# Patient Record
Sex: Male | Born: 1955 | Race: Black or African American | Hispanic: No | Marital: Married | Smoking: Never smoker
Health system: Southern US, Community
[De-identification: ages and names within clinical notes are randomized; demographics above are authoritative.]

## PROBLEM LIST (undated history)

## (undated) DIAGNOSIS — E785 Hyperlipidemia, unspecified: Secondary | ICD-10-CM

## (undated) HISTORY — DX: Hyperlipidemia, unspecified: E78.5

---

## 2006-07-25 ENCOUNTER — Emergency Department (HOSPITAL_COMMUNITY): Admission: EM | Admit: 2006-07-25 | Discharge: 2006-07-25 | Payer: Self-pay | Admitting: Emergency Medicine

## 2014-11-01 ENCOUNTER — Ambulatory Visit (INDEPENDENT_AMBULATORY_CARE_PROVIDER_SITE_OTHER): Payer: 59

## 2014-11-01 ENCOUNTER — Ambulatory Visit (INDEPENDENT_AMBULATORY_CARE_PROVIDER_SITE_OTHER): Payer: 59 | Admitting: Internal Medicine

## 2014-11-01 VITALS — BP 114/70 | HR 88 | Temp 97.7°F | Resp 16 | Ht 75.0 in | Wt 148.8 lb

## 2014-11-01 DIAGNOSIS — R9431 Abnormal electrocardiogram [ECG] [EKG]: Secondary | ICD-10-CM

## 2014-11-01 DIAGNOSIS — Z0000 Encounter for general adult medical examination without abnormal findings: Secondary | ICD-10-CM | POA: Diagnosis not present

## 2014-11-01 LAB — LIPID PANEL
Cholesterol: 212 mg/dL — ABNORMAL HIGH (ref 0–200)
HDL: 91 mg/dL (ref >=40)
LDL CALC: 110 mg/dL — AB (ref 0–99)
TRIGLYCERIDES: 54 mg/dL (ref <150)
Total CHOL/HDL Ratio: 2.3 Ratio
VLDL: 11 mg/dL (ref 0–40)

## 2014-11-01 LAB — POCT CBC
Granulocyte percent: 72.3 %G (ref 37–80)
HCT, POC: 49.8 % (ref 43.5–53.7)
HEMOGLOBIN: 14.8 g/dL (ref 14.1–18.1)
LYMPH, POC: 1 (ref 0.6–3.4)
MCH: 25.3 pg — AB (ref 27–31.2)
MCHC: 29.8 g/dL — AB (ref 31.8–35.4)
MCV: 85.1 fL (ref 80–97)
MID (CBC): 0.8 (ref 0–0.9)
MPV: 7.4 fL (ref 0–99.8)
POC GRANULOCYTE: 4.5 (ref 2–6.9)
POC LYMPH %: 15.4 % (ref 10–50)
POC MID %: 12.3 % — AB (ref 0–12)
Platelet Count, POC: 246 10*3/uL (ref 142–424)
RBC: 5.85 M/uL (ref 4.69–6.13)
RDW, POC: 16.3 %
WBC: 6.2 10*3/uL (ref 4.6–10.2)

## 2014-11-01 LAB — POCT URINALYSIS DIPSTICK
Bilirubin, UA: NEGATIVE
GLUCOSE UA: 100
Ketones, UA: NEGATIVE
Leukocytes, UA: NEGATIVE
NITRITE UA: NEGATIVE
PROTEIN UA: NEGATIVE
Spec Grav, UA: 1.025
UROBILINOGEN UA: 0.2
pH, UA: 6

## 2014-11-01 LAB — POCT UA - MICROSCOPIC ONLY
BACTERIA, U MICROSCOPIC: NEGATIVE
CASTS, UR, LPF, POC: NEGATIVE
CRYSTALS, UR, HPF, POC: NEGATIVE
Epithelial cells, urine per micros: NEGATIVE
MUCUS UA: NEGATIVE
Yeast, UA: NEGATIVE

## 2014-11-01 LAB — COMPREHENSIVE METABOLIC PANEL
ALK PHOS: 79 U/L (ref 39–117)
ALT: 22 U/L (ref 0–53)
AST: 22 U/L (ref 0–37)
Albumin: 4.1 g/dL (ref 3.5–5.2)
BILIRUBIN TOTAL: 1 mg/dL (ref 0.2–1.2)
BUN: 14 mg/dL (ref 6–23)
CO2: 31 mEq/L (ref 19–32)
Calcium: 9.6 mg/dL (ref 8.4–10.5)
Chloride: 101 mEq/L (ref 96–112)
Creat: 1.04 mg/dL (ref 0.50–1.35)
GLUCOSE: 70 mg/dL (ref 70–99)
Potassium: 4.1 mEq/L (ref 3.5–5.3)
SODIUM: 139 meq/L (ref 135–145)
TOTAL PROTEIN: 8.3 g/dL (ref 6.0–8.3)

## 2014-11-01 LAB — TSH: TSH: 1.483 u[IU]/mL (ref 0.350–4.500)

## 2014-11-01 NOTE — Progress Notes (Signed)
Subjective:    Patient ID: Raymond Moon, male    DOB: 1956-04-04, 59 y.o.   MRN: [id number]  HPI Feels healthy and strong. Wants ist cpe in 4-5 years. Exercises 5ds/week, always feels good Is UTD on immunizations.  Review of Systems  Constitutional: Negative.   HENT: Negative.   Eyes: Negative.   Respiratory: Negative.   Cardiovascular: Negative.   Gastrointestinal: Negative.   Endocrine: Negative.   Genitourinary: Negative.   Musculoskeletal: Negative.   Skin: Negative.   Allergic/Immunologic: Negative.   Neurological: Negative.   Hematological: Negative.   Psychiatric/Behavioral: Negative.        Objective:   Physical Exam  Constitutional: He is oriented to person, place, and time. He appears well-developed and well-nourished.  HENT:  Head: Normocephalic.  Right Ear: External ear normal.  Left Ear: External ear normal.  Nose: Nose normal.  Mouth/Throat: Oropharynx is clear and moist.  Eyes: Conjunctivae and EOM are normal. Pupils are equal, round, and reactive to light.  Neck: Normal range of motion. No tracheal deviation present. No thyromegaly present.  Cardiovascular: Normal rate, regular rhythm, normal heart sounds and intact distal pulses.  Exam reveals no gallop and no friction rub.   No murmur heard. Pulmonary/Chest: Effort normal and breath sounds normal.  Abdominal: Soft. Bowel sounds are normal.  Genitourinary: Rectum normal, prostate normal and penis normal.  Musculoskeletal: Normal range of motion.  Lymphadenopathy:    He has no cervical adenopathy.  Neurological: He is alert and oriented to person, place, and time. He has normal reflexes. He displays normal reflexes. No cranial nerve deficit. He exhibits normal muscle tone. Coordination normal.  Psychiatric: He has a normal mood and affect. His behavior is normal. Judgment and thought content normal.   Results for orders placed or performed in visit on 11/01/14  POCT CBC  Result Value Ref Range     WBC 6.2 4.6 - 10.2 K/uL   Lymph, poc 1.0 0.6 - 3.4   POC LYMPH PERCENT 15.4 10 - 50 %L   MID (cbc) 0.8 0 - 0.9   POC MID % 12.3 (A) 0 - 12 %M   POC Granulocyte 4.5 2 - 6.9   Granulocyte percent 72.3 37 - 80 %G   RBC 5.85 4.69 - 6.13 M/uL   Hemoglobin 14.8 14.1 - 18.1 g/dL   HCT, POC 08.649.8 57.843.5 - 53.7 %   MCV 85.1 80 - 97 fL   MCH, POC 25.3 (A) 27 - 31.2 pg   MCHC 29.8 (A) 31.8 - 35.4 g/dL   RDW, POC 46.916.3 %   Platelet Count, POC 246 142 - 424 K/uL   MPV 7.4 0 - 99.8 fL  POCT urinalysis dipstick  Result Value Ref Range   Color, UA yellow    Clarity, UA clear    Glucose, UA 100    Bilirubin, UA neg    Ketones, UA neg    Spec Grav, UA 1.025    Blood, UA tr-lysed    pH, UA 6.0    Protein, UA neg    Urobilinogen, UA 0.2    Nitrite, UA neg    Leukocytes, UA Negative   POCT UA - Microscopic Only  Result Value Ref Range   WBC, Ur, HPF, POC 0-1    RBC, urine, microscopic 0-5    Bacteria, U Microscopic neg    Mucus, UA neg    Epithelial cells, urine per micros neg    Crystals, Ur, HPF, POC neg    Casts,  Ur, LPF, POC neg    Yeast, UA neg     [facility] reading (PRIMARY) by  DrGuest hyperinflation, no hx of smoking or asthma.  EKG abnormal, probable his normal variant     Assessment & Plan:  Normal CPE Consult cardiology to review abnormal ekg

## 2014-11-01 NOTE — Patient Instructions (Signed)
Immunization Schedule, Adult  Influenza vaccine.  All adults should be immunized every year.  All adults, including pregnant women and people with hives-only allergy to eggs can receive the inactivated influenza (IIV) vaccine.  Adults aged 58-49 years can receive the recombinant influenza (RIV) vaccine. The RIV vaccine does not contain any egg protein.  Adults aged 76 years or older can receive the standard-dose IIV or the high-dose IIV.  Tetanus, diphtheria, and acellular pertussis (Td, Tdap) vaccine.  Pregnant women should receive 1 dose of Tdap vaccine during each pregnancy. The dose should be obtained regardless of the length of time since the last dose. Immunization is preferred during the 27th to 36th week of gestation.  An adult who has not previously received Tdap or who does not know his or her vaccine status should receive 1 dose of Tdap. This initial dose should be followed by tetanus and diphtheria toxoids (Td) booster doses every 10 years.  Adults with an unknown or incomplete history of completing a 3-dose immunization series with Td-containing vaccines should begin or complete a primary immunization series including a Tdap dose.  Adults should receive a Td booster every 10 years.  Varicella vaccine.  An adult without evidence of immunity to varicella should receive 2 doses or a second dose if he or she has previously received 1 dose.  Pregnant females who do not have evidence of immunity should receive the first dose after pregnancy. This first dose should be obtained before leaving the health care facility. The second dose should be obtained 4-8 weeks after the first dose.  Human papillomavirus (HPV) vaccine.  Females aged 13-26 years who have not received the vaccine previously should obtain the 3-dose series.  The vaccine is not recommended for use in pregnant females. However, pregnancy testing is not needed before receiving a dose. If a male is found to be  pregnant after receiving a dose, no treatment is needed. In that case, the remaining doses should be delayed until after the pregnancy.  Males aged 37-21 years who have not received the vaccine previously should receive the 3-dose series. Males aged 22-26 years may be immunized.  Immunization is recommended through the age of 93 years for any male who has sex with males and did not get any or all doses earlier.  Immunization is recommended for any person with an immunocompromised condition through the age of 71 years if he or she did not get any or all doses earlier.  During the 3-dose series, the second dose should be obtained 4-8 weeks after the first dose. The third dose should be obtained 24 weeks after the first dose and 16 weeks after the second dose.  Zoster vaccine.  One dose is recommended for adults aged 73 years or older unless certain conditions are present.  Measles, mumps, and rubella (MMR) vaccine.  Adults born before 35 generally are considered immune to measles and mumps.  Adults born in [birth year] or later should have 1 or more doses of MMR vaccine unless there is a contraindication to the vaccine or there is laboratory evidence of immunity to each of the three diseases.  A routine second dose of MMR vaccine should be obtained at least 28 days after the first dose for students attending postsecondary schools, health care workers, or international travelers.  People who received inactivated measles vaccine or an unknown type of measles vaccine during 1963-1967 should receive 2 doses of MMR vaccine.  People who received inactivated mumps vaccine or an unknown type  of mumps vaccine before 1979 and are at high risk for mumps infection should consider immunization with 2 doses of MMR vaccine.  For females of childbearing age, rubella immunity should be determined. If there is no evidence of immunity, females who are not pregnant should be vaccinated. If there is no evidence of  immunity, females who are pregnant should delay immunization until after pregnancy.  Unvaccinated health care workers born before 1957 who lack laboratory evidence of measles, mumps, or rubella immunity or laboratory confirmation of disease should consider measles and mumps immunization with 2 doses of MMR vaccine or rubella immunization with 1 dose of MMR vaccine.  Pneumococcal 13-valent conjugate (PCV13) vaccine.  When indicated, a person who is uncertain of his or her immunization history and has no record of immunization should receive the PCV13 vaccine.  An adult aged 19 years or older who has certain medical conditions and has not been previously immunized should receive 1 dose of PCV13 vaccine. This PCV13 should be followed with a dose of pneumococcal polysaccharide (PPSV23) vaccine. The PPSV23 vaccine dose should be obtained at least 8 weeks after the dose of PCV13 vaccine.  An adult aged 19 years or older who has certain medical conditions and previously received 1 or more doses of PPSV23 vaccine should receive 1 dose of PCV13. The PCV13 vaccine dose should be obtained 1 or more years after the last PPSV23 vaccine dose.  Pneumococcal polysaccharide (PPSV23) vaccine.  When PCV13 is also indicated, PCV13 should be obtained first.  All adults aged 65 years and older should be immunized.  An adult younger than age 65 years who has certain medical conditions should be immunized.  Any person who resides in a nursing home or long-term care facility should be immunized.  An adult smoker should be immunized.  People with an immunocompromised condition and certain other conditions should receive both PCV13 and PPSV23 vaccines.  People with human immunodeficiency virus (HIV) infection should be immunized as soon as possible after diagnosis.  Immunization during chemotherapy or radiation therapy should be avoided.  Routine use of PPSV23 vaccine is not recommended for American Indians,  [place] Natives, or people younger than 65 years unless there are medical conditions that require PPSV23 vaccine.  When indicated, people who have unknown immunization and have no record of immunization should receive PPSV23 vaccine.  One-time revaccination 5 years after the first dose of PPSV23 is recommended for people aged 19-64 years who have chronic kidney failure, nephrotic syndrome, asplenia, or immunocompromised conditions.  People who received 1-2 doses of PPSV23 before age 65 years should receive another dose of PPSV23 vaccine at age 65 years or later if at least 5 years have passed since the previous dose.  Doses of PPSV23 are not needed for people immunized with PPSV23 at or after age 65 years.  Meningococcal vaccine.  Adults with asplenia or persistent complement component deficiencies should receive 2 doses of quadrivalent meningococcal conjugate (MenACWY-D) vaccine. The doses should be obtained at least 2 months apart.  Microbiologists working with certain meningococcal bacteria, military recruits, people at risk during an outbreak, and people who travel to or live in countries with a high rate of meningitis should be immunized.  A first-year college student up through age 21 years who is living in a residence hall should receive a dose if he or she did not receive a dose on or after his or her 16th birthday.  Adults who have certain high-risk conditions should receive one or more doses   of vaccine.  Hepatitis A vaccine.  Adults who wish to be protected from this disease, have certain high-risk conditions, work with hepatitis A-infected animals, work in hepatitis A research labs, or travel to or work in countries with a high rate of hepatitis A should be immunized.  Adults who were previously unvaccinated and who anticipate close contact with an international adoptee during the first 60 days after arrival in the [place] from a country with a high rate of hepatitis A should  be immunized.  Hepatitis B vaccine.  Adults who wish to be protected from this disease, have certain high-risk conditions, may be exposed to blood or other infectious body fluids, are household contacts or sex partners of hepatitis B positive people, are clients or workers in certain care facilities, or travel to or work in countries with a high rate of hepatitis B should be immunized.  Haemophilus influenzae type b (Hib) vaccine.  A previously unvaccinated person with asplenia or sickle cell disease or having a scheduled splenectomy should receive 1 dose of Hib vaccine.  Regardless of previous immunization, a recipient of a hematopoietic stem cell transplant should receive a 3-dose series 6-12 months after his or her successful transplant.  Hib vaccine is not recommended for adults with HIV infection. Document Released: 10/26/2003 Document Revised: 11/30/2012 Document Reviewed: 09/22/2012 Exit[employer] Patient Information 2015 [employer]. This information is not intended to replace advice given to you by your health care provider. Make sure you discuss any questions you have with your health care provider.  

## 2014-11-02 LAB — PSA: PSA: 0.54 ng/mL (ref <=4.00)

## 2014-12-30 ENCOUNTER — Ambulatory Visit (INDEPENDENT_AMBULATORY_CARE_PROVIDER_SITE_OTHER): Payer: 59 | Admitting: Family Medicine

## 2014-12-30 VITALS — BP 124/78 | HR 86 | Temp 98.0°F | Resp 18 | Ht 76.5 in | Wt 151.0 lb

## 2014-12-30 DIAGNOSIS — R9431 Abnormal electrocardiogram [ECG] [EKG]: Secondary | ICD-10-CM

## 2014-12-30 DIAGNOSIS — R350 Frequency of micturition: Secondary | ICD-10-CM | POA: Diagnosis not present

## 2014-12-30 DIAGNOSIS — R42 Dizziness and giddiness: Secondary | ICD-10-CM | POA: Diagnosis not present

## 2014-12-30 LAB — POCT CBC
GRANULOCYTE PERCENT: 72.2 % (ref 37–80)
HEMATOCRIT: 49 % (ref 43.5–53.7)
HEMOGLOBIN: 15.5 g/dL (ref 14.1–18.1)
Lymph, poc: 0.5 — AB (ref 0.6–3.4)
MCH, POC: 26.4 pg — AB (ref 27–31.2)
MCHC: 31.5 g/dL — AB (ref 31.8–35.4)
MCV: 83.8 fL (ref 80–97)
MID (cbc): 0.3 (ref 0–0.9)
MPV: 7.1 fL (ref 0–99.8)
PLATELET COUNT, POC: 202 10*3/uL (ref 142–424)
POC Granulocyte: 2.1 (ref 2–6.9)
POC LYMPH PERCENT: 16.8 %L (ref 10–50)
POC MID %: 11 %M (ref 0–12)
RBC: 5.85 M/uL (ref 4.69–6.13)
RDW, POC: 14.8 %
WBC: 2.9 10*3/uL — AB (ref 4.6–10.2)

## 2014-12-30 LAB — BASIC METABOLIC PANEL
BUN: 11 mg/dL (ref 6–23)
CHLORIDE: 99 meq/L (ref 96–112)
CO2: 30 meq/L (ref 19–32)
Calcium: 9.5 mg/dL (ref 8.4–10.5)
Creat: 1.17 mg/dL (ref 0.50–1.35)
Glucose, Bld: 84 mg/dL (ref 70–99)
POTASSIUM: 4.2 meq/L (ref 3.5–5.3)
Sodium: 137 mEq/L (ref 135–145)

## 2014-12-30 LAB — POCT UA - MICROSCOPIC ONLY
BACTERIA, U MICROSCOPIC: NEGATIVE
CRYSTALS, UR, HPF, POC: NEGATIVE
Casts, Ur, LPF, POC: NEGATIVE
Mucus, UA: NEGATIVE
RBC, urine, microscopic: NEGATIVE
WBC, UR, HPF, POC: NEGATIVE
YEAST UA: NEGATIVE

## 2014-12-30 LAB — POCT URINALYSIS DIPSTICK
Bilirubin, UA: NEGATIVE
Glucose, UA: NEGATIVE
KETONES UA: 15
LEUKOCYTES UA: NEGATIVE
Nitrite, UA: NEGATIVE
PH UA: 5.5
PROTEIN UA: NEGATIVE
Spec Grav, UA: 1.02
Urobilinogen, UA: 0.2

## 2014-12-30 LAB — GLUCOSE, POCT (MANUAL RESULT ENTRY): POC GLUCOSE: 91 mg/dL (ref 70–99)

## 2014-12-30 NOTE — Progress Notes (Signed)
Subjective:  This chart was scribed for Meredith Staggers, MD by Elveria Rising, Medial Scribe. This patient was seen in room 11 and the patient's care was started at 9:46 AM.    Patient ID: Raymond Moon, male    DOB: 1956-07-06, 59 y.o.   MRN: [id number] Chief Complaint  Patient presents with  . Dizziness    for 10 days    HPI HPI Comments: Raymond Moon is a 59 y.o. male who presents to the [facility] complaining of lightheadedness ongoing for two weeks now. Patient last seen by Dr. Perrin Maltese 3/15 for annual physical exam; patient feeling well at visit and was exercising 5 times a week. Patient noted to have abnormal EKG and thus referred to cardiology outpatient. Patient with upcoming appointment with with Dr. Jacinto Halim 5/20.   Patient reports associated fatigue and recounts an episode that occurred when cutting the grass recently. Patient denies shortness of breath, chest pain or tightness, palpitations, melena, blood in stool, syncope, dizziness describes as room spinning sensation, headache or weakness in his extremities. Patient does reports urinary frequency onset 3-4 months ago; patient denies dysuria. Patient states that he has not kept a 5 day a week exercise regimen for one month now. Patient denies chest pain, chest tightness, or other complications during activity, but states that he has ceased due to time. Patient denies any pertinent life style changes in the last two weeks.   Noticed when mowing lawn 3 days ago - had to stop twice as fatigued. Usually can mow the whole lawn with only 1 break.   Feels a little better with drinking more water.   Lab Results  Component Value Date   PSA 0.54 11/01/2014     There are no active problems to display for this patient.  History reviewed. No pertinent past medical history. History reviewed. No pertinent past surgical history. No Known Allergies Prior to Admission medications   Not on File   History   Social  History  . Marital Status: Married    Spouse Name: N/A  . Number of Children: N/A  . Years of Education: N/A   Occupational History  . Not on file.   Social History Main Topics  . Smoking status: Never Smoker   . Smokeless tobacco: Not on file  . Alcohol Use: 0.0 oz/week    0 Standard drinks or equivalent per week  . Drug Use: No  . Sexual Activity:    Partners: Male   Other Topics Concern  . Not on file   Social History Narrative      Review of Systems  Constitutional: Positive for fatigue. Negative for fever.  Respiratory: Negative for chest tightness and shortness of breath.   Cardiovascular: Negative for chest pain and palpitations.  Gastrointestinal: Negative for diarrhea, constipation and blood in stool.  Genitourinary: Positive for frequency. Negative for dysuria.  Neurological: Positive for light-headedness. Negative for weakness and headaches.       Objective:   Physical Exam  Constitutional: He is oriented to person, place, and time. He appears well-developed and well-nourished. No distress.  HENT:  Head: Normocephalic and atraumatic.  Eyes: EOM are normal. Pupils are equal, round, and reactive to light.  Neck: Neck supple. No JVD present. Carotid bruit is not present. No tracheal deviation present.  No palpable thyroid enlargement or nodules.  No carotid bruit.   Cardiovascular: Normal rate, regular rhythm and normal heart sounds.  Exam reveals no gallop and no friction rub.  No murmur heard. Pulmonary/Chest: Effort normal and breath sounds normal. No respiratory distress. He has no rales.  Musculoskeletal: Normal range of motion. He exhibits no edema.  Wingspan is 80inches Height 6'4''  Neurological: He is alert and oriented to person, place, and time.  Equal facial movements. Normal speech.   Skin: Skin is warm and dry.  Psychiatric: He has a normal mood and affect. His behavior is normal.  Nursing note and vitals reviewed.    Filed Vitals:    12/30/14 0853  BP: 124/78  Pulse: 86  Temp: 98 F (36.7 C)  TempSrc: Oral  Resp: 18  Height: 6' 4.5" (1.943 m)  Weight: 151 lb (68.493 kg)  SpO2: 97%   EKG: compared to 11/01/14 EKG: persistent ST abnormality/twave chnages in V2-V4.   Results for orders placed or performed in visit on 12/30/14  POCT CBC  Result Value Ref Range   WBC 2.9 (A) 4.6 - 10.2 K/uL   Lymph, poc 0.5 (A) 0.6 - 3.4   POC LYMPH PERCENT 16.8 10 - 50 %L   MID (cbc) 0.3 0 - 0.9   POC MID % 11.0 0 - 12 %M   POC Granulocyte 2.1 2 - 6.9   Granulocyte percent 72.2 37 - 80 %G   RBC 5.85 4.69 - 6.13 M/uL   Hemoglobin 15.5 14.1 - 18.1 g/dL   HCT, POC 16.149.0 09.643.5 - 53.7 %   MCV 83.8 80 - 97 fL   MCH, POC 26.4 (A) 27 - 31.2 pg   MCHC 31.5 (A) 31.8 - 35.4 g/dL   RDW, POC 04.514.8 %   Platelet Count, POC 202 142 - 424 K/uL   MPV 7.1 0 - 99.8 fL  POCT glucose (manual entry)  Result Value Ref Range   POC Glucose 91 70 - 99 mg/dl  POCT urinalysis dipstick  Result Value Ref Range   Color, UA yellow    Clarity, UA clear    Glucose, UA neg    Bilirubin, UA neg    Ketones, UA 15    Spec Grav, UA 1.020    Blood, UA trace-lysed    pH, UA 5.5    Protein, UA neg    Urobilinogen, UA 0.2    Nitrite, UA neg    Leukocytes, UA Negative   POCT UA - Microscopic Only  Result Value Ref Range   WBC, Ur, HPF, POC neg    RBC, urine, microscopic neg    Bacteria, U Microscopic neg    Mucus, UA neg    Epithelial cells, urine per micros 0-1    Crystals, Ur, HPF, POC neg    Casts, Ur, LPF, POC neg    Yeast, UA neg         Assessment & Plan:  Raymond BumpsJoseph Moon is a 59 y.o. male Dizziness - Plan: EKG 12-Lead, POCT CBC, POCT glucose (manual entry), Basic metabolic panel  Lightheadedness - Plan: EKG 12-Lead, POCT CBC, POCT glucose (manual entry), Basic metabolic panel  Urinary frequency - Plan: POCT urinalysis dipstick, POCT UA - Microscopic Only  Nonspecific abnormal electrocardiogram (ECG) (EKG)  Abnormal EKG concerning for  underlying CV disease or silent MI.  Asymptomatic at physical 2 months ago, but now with fatigue with activity and baseline lightheadedness. No known family history of CAD.  Does have increased armspan to height, but no known hx of heart murmur or heart problems when younger. CV risk factors of mild hyperlipidemia, age.   -discussed with cardiologist - will see Mr. Cleophus Moltitters  later today. Out of work note given.   No orders of the defined types were placed in this encounter.   Patient Instructions  I spoke with Dr. Jacinto HalimGanji about your abnormal EKG and symptoms- he would like to see you this afternoon.  Arrive at his office at 2pm.  He is working you into his schedule, so there may be a wait.  [facility] [address] [place], [place] [address] [phone] office       I personally performed the services described in this documentation, which was scribed in my presence. The recorded information has been reviewed and considered, and addended by me as needed.

## 2014-12-30 NOTE — Patient Instructions (Addendum)
I spoke with Dr. Jacinto HalimGanji about your abnormal EKG and symptoms- he would like to see you this afternoon.  Arrive at his office at 2pm.  He is working you into his schedule, so there may be a wait.  [facility] [address] [place], [place] [address] [phone] office

## 2016-12-20 ENCOUNTER — Encounter: Payer: Self-pay | Admitting: Physician Assistant

## 2016-12-20 ENCOUNTER — Ambulatory Visit (INDEPENDENT_AMBULATORY_CARE_PROVIDER_SITE_OTHER): Payer: 59 | Admitting: Physician Assistant

## 2016-12-20 VITALS — BP 109/74 | HR 75 | Temp 97.9°F | Resp 16 | Ht 76.0 in | Wt 147.0 lb

## 2016-12-20 DIAGNOSIS — Z114 Encounter for screening for human immunodeficiency virus [HIV]: Secondary | ICD-10-CM | POA: Diagnosis not present

## 2016-12-20 DIAGNOSIS — Z131 Encounter for screening for diabetes mellitus: Secondary | ICD-10-CM | POA: Diagnosis not present

## 2016-12-20 DIAGNOSIS — Z1329 Encounter for screening for other suspected endocrine disorder: Secondary | ICD-10-CM

## 2016-12-20 DIAGNOSIS — Z130 Encounter for screening for diseases of the blood and blood-forming organs and certain disorders involving the immune mechanism: Secondary | ICD-10-CM

## 2016-12-20 DIAGNOSIS — Z125 Encounter for screening for malignant neoplasm of prostate: Secondary | ICD-10-CM | POA: Diagnosis not present

## 2016-12-20 DIAGNOSIS — Z13220 Encounter for screening for lipoid disorders: Secondary | ICD-10-CM | POA: Diagnosis not present

## 2016-12-20 DIAGNOSIS — Z0000 Encounter for general adult medical examination without abnormal findings: Secondary | ICD-10-CM | POA: Diagnosis not present

## 2016-12-20 DIAGNOSIS — Z1159 Encounter for screening for other viral diseases: Secondary | ICD-10-CM

## 2016-12-20 DIAGNOSIS — Z1389 Encounter for screening for other disorder: Secondary | ICD-10-CM | POA: Diagnosis not present

## 2016-12-20 NOTE — Progress Notes (Addendum)
12/20/2016 12:05 PM   DOB: 05-23-1956 / MRN: [id number]  SUBJECTIVE:  Raymond Moon is a 61 y.o. male presenting for annual exam.  Tells me he had a colonoscopy in 2015 and is due back in 2020.  He is never smoker.  He takes no medications. He denies pain today.  Denies any previous problems with his health.  Did have an episode of dizziness back in 2016 and eventually saw Dr. Nadyne Coombes who ordered an echo which showed some mitral valve regurgitation and mild diastolic dysfunction.  EF normal.   He feels well overall.  He does walk about 35 minutes 3 times week and this is a moderate pace. Denies a family history of CAD.   Immunization History  Administered Date(s) Administered  . Influenza-Unspecified 08/02/2014  . Td 11/01/2007     He has No Known Allergies.   He  has no past medical history on file.    He  reports that he has never smoked. He has never used smokeless tobacco. He reports that he drinks alcohol. He reports that he does not use drugs. He  reports that he currently engages in sexual activity and has had male partners. The patient  has no past surgical history on file.  His family history is not on file.  Review of Systems  Constitutional: Negative for chills, diaphoresis and fever.  Respiratory: Negative for cough, hemoptysis, sputum production, shortness of breath and wheezing.   Cardiovascular: Negative for chest pain, orthopnea and leg swelling.  Gastrointestinal: Negative for nausea.  Skin: Negative for rash.  Neurological: Negative for dizziness.    The problem list and medications were reviewed and updated by myself where necessary and exist elsewhere in the encounter.   OBJECTIVE:  BP 109/74   Pulse 75   Temp 97.9 F (36.6 C) (Oral)   Resp 16   Ht '6\' 4"'  (1.93 m)   Wt 147 lb (66.7 kg)   SpO2 99%   BMI 17.89 kg/m   Physical Exam  Constitutional: He is oriented to person, place, and time. He appears well-developed. He is active and cooperative.  Non-toxic  appearance.  Cardiovascular: Normal rate, regular rhythm, S1 normal, S2 normal, normal heart sounds, intact distal pulses and normal pulses.  Exam reveals no gallop and no friction rub.   No murmur heard. Pulmonary/Chest: Effort normal. No tachypnea. He has no rales.  Abdominal: He exhibits no distension.  Musculoskeletal: He exhibits no edema.  Neurological: He is alert and oriented to person, place, and time.  Skin: Skin is warm and dry. He is not diaphoretic. No pallor.  Vitals reviewed.  Wt Readings from Last 3 Encounters:  12/20/16 147 lb (66.7 kg)  12/30/14 151 lb (68.5 kg)  11/01/14 148 lb 12.8 oz (67.5 kg)   No results found for this or any previous visit (from the past 72 hour(s)).  No results found.  Lab Results  Component Value Date   PSA 0.54 11/01/2014     ASSESSMENT AND PLAN:  Raymond Moon was seen today for annual exam.  Diagnoses and all orders for this visit:  Annual physical exam: Chronically thin male here today for physical. He feels well today.  Advised he attempt to get 5 days of physical activity.  No ASCVD risk factors.   Screening for deficiency anemia -     CBC  Screening for nephropathy -     CMP14+EGFR  Screening for lipid disorders -     Lipid panel  Screening for thyroid disorder -  TSH  Screening for diabetes mellitus -     Hemoglobin A1c  Screening for HIV (human immunodeficiency virus) -     HIV antibody -     Cancel: RPR  Need for hepatitis C screening test -     Hepatitis C antibody  Screening PSA: Previously normal.  Will check for interval changes.   The patient is advised to call or return to clinic if he does not see an improvement in symptoms, or to seek the care of the closest emergency department if he worsens with the above plan.   Philis Fendt, MHS, PA-C [facility] 12/20/2016 12:05 PM

## 2016-12-20 NOTE — Patient Instructions (Addendum)
Please go to your pharmacy once you turn 61 to receive your shingles shot. Please save the documentation and bring it in at next visit so that we can document that you received that vaccine. Thank you.    IF you received an x-ray today, you will receive an invoice from [facility]. Please contact [facility] at [phone] with questions or concerns regarding your invoice.   IF you received labwork today, you will receive an invoice from [facility]. Please contact [facility] at [phone] with questions or concerns regarding your invoice.   Our billing staff will not be able to assist you with questions regarding bills from these companies.  You will be contacted with the lab results as soon as they are available. The fastest way to get your results is to activate your My Chart account. Instructions are located on the last page of this paperwork. If you have not heard from us regarding the results in 2 weeks, please contact this office.

## 2016-12-21 LAB — CBC
Hematocrit: 45 % (ref 37.5–51.0)
Hemoglobin: 14.6 g/dL (ref 13.0–17.7)
MCH: 27.7 pg (ref 26.6–33.0)
MCHC: 32.4 g/dL (ref 31.5–35.7)
MCV: 85 fL (ref 79–97)
PLATELETS: 227 10*3/uL (ref 150–379)
RBC: 5.28 x10E6/uL (ref 4.14–5.80)
RDW: 14 % (ref 12.3–15.4)
WBC: 3.5 10*3/uL (ref 3.4–10.8)

## 2016-12-21 LAB — CMP14+EGFR
A/G RATIO: 1.4 (ref 1.2–2.2)
ALT: 21 IU/L (ref 0–44)
AST: 27 IU/L (ref 0–40)
Albumin: 4.6 g/dL (ref 3.5–5.5)
Alkaline Phosphatase: 61 IU/L (ref 39–117)
BUN/Creatinine Ratio: 12 (ref 9–20)
BUN: 15 mg/dL (ref 6–24)
Bilirubin Total: 1.5 mg/dL — ABNORMAL HIGH (ref 0.0–1.2)
CALCIUM: 9.6 mg/dL (ref 8.7–10.2)
CO2: 24 mmol/L (ref 18–29)
CREATININE: 1.23 mg/dL (ref 0.76–1.27)
Chloride: 100 mmol/L (ref 96–106)
GFR, EST AFRICAN AMERICAN: 74 mL/min/{1.73_m2} (ref >59)
GFR, EST NON AFRICAN AMERICAN: 64 mL/min/{1.73_m2} (ref >59)
GLOBULIN, TOTAL: 3.3 g/dL (ref 1.5–4.5)
Glucose: 109 mg/dL — ABNORMAL HIGH (ref 65–99)
Potassium: 4 mmol/L (ref 3.5–5.2)
Sodium: 142 mmol/L (ref 134–144)
TOTAL PROTEIN: 7.9 g/dL (ref 6.0–8.5)

## 2016-12-21 LAB — HEMOGLOBIN A1C
Est. average glucose Bld gHb Est-mCnc: 126 mg/dL
Hgb A1c MFr Bld: 6 % — ABNORMAL HIGH (ref 4.8–5.6)

## 2016-12-21 LAB — TSH: TSH: 1.33 u[IU]/mL (ref 0.450–4.500)

## 2016-12-21 LAB — LIPID PANEL
CHOL/HDL RATIO: 2.2 ratio (ref 0.0–5.0)
Cholesterol, Total: 203 mg/dL — ABNORMAL HIGH (ref 100–199)
HDL: 93 mg/dL (ref >39)
LDL CALC: 100 mg/dL — AB (ref 0–99)
TRIGLYCERIDES: 48 mg/dL (ref 0–149)
VLDL Cholesterol Cal: 10 mg/dL (ref 5–40)

## 2016-12-21 LAB — HEPATITIS C ANTIBODY: Hep C Virus Ab: <0.1 s/co ratio (ref 0.0–0.9)

## 2016-12-21 LAB — PSA: PROSTATE SPECIFIC AG, SERUM: 0.8 ng/mL (ref 0.0–4.0)

## 2016-12-21 LAB — HIV ANTIBODY (ROUTINE TESTING W REFLEX): HIV Screen 4th Generation wRfx: NONREACTIVE

## 2016-12-23 ENCOUNTER — Encounter: Payer: Self-pay | Admitting: Physician Assistant

## 2016-12-23 DIAGNOSIS — R7303 Prediabetes: Secondary | ICD-10-CM | POA: Insufficient documentation

## 2017-06-16 ENCOUNTER — Encounter: Payer: Self-pay | Admitting: Physician Assistant

## 2017-06-16 ENCOUNTER — Ambulatory Visit (INDEPENDENT_AMBULATORY_CARE_PROVIDER_SITE_OTHER): Payer: 59 | Admitting: Physician Assistant

## 2017-06-16 VITALS — BP 112/58 | HR 82 | Temp 97.6°F | Resp 16 | Ht 76.0 in | Wt 146.2 lb

## 2017-06-16 DIAGNOSIS — R7309 Other abnormal glucose: Secondary | ICD-10-CM | POA: Diagnosis not present

## 2017-06-16 DIAGNOSIS — R636 Underweight: Secondary | ICD-10-CM

## 2017-06-16 DIAGNOSIS — Z23 Encounter for immunization: Secondary | ICD-10-CM | POA: Diagnosis not present

## 2017-06-16 LAB — POCT GLYCOSYLATED HEMOGLOBIN (HGB A1C): HEMOGLOBIN A1C: 5.9

## 2017-06-16 NOTE — Patient Instructions (Signed)
     IF you received an x-ray today, you will receive an invoice from [facility]. Please contact [facility] at [phone] with questions or concerns regarding your invoice.   IF you received labwork today, you will receive an invoice from [facility]. Please contact [facility] at [phone] with questions or concerns regarding your invoice.   Our billing staff will not be able to assist you with questions regarding bills from these companies.  You will be contacted with the lab results as soon as they are available. The fastest way to get your results is to activate your My Chart account. Instructions are located on the last page of this paperwork. If you have not heard from us regarding the results in 2 weeks, please contact this office.     

## 2017-06-16 NOTE — Progress Notes (Signed)
06/16/2017 12:14 PM   DOB: November 24, 1955 / MRN: [id number]  SUBJECTIVE:  Raymond Moon is a 61 y.o. male presenting for recheck of lipid panel. He does walk about 35 minutes 3 times week and this is a moderate pace. Denies a family history of CAD. Would like a pneumonia shot today. Never smoker.   He has No Known Allergies.   He  has no past medical history on file.    He  reports that he has never smoked. He has never used smokeless tobacco. He reports that he drinks alcohol. He reports that he does not use drugs. He  reports that he currently engages in sexual activity and has had male partners. The patient  has no past surgical history on file.  His family history is not on file.  Review of Systems  Constitutional: Negative for chills, diaphoresis and fever.  Eyes: Negative.   Respiratory: Negative for cough, hemoptysis, sputum production, shortness of breath and wheezing.   Cardiovascular: Negative for chest pain, orthopnea and leg swelling.  Gastrointestinal: Negative for nausea.  Skin: Negative for rash.  Neurological: Negative for dizziness, sensory change, speech change, focal weakness and headaches.    The problem list and medications were reviewed and updated by myself where necessary and exist elsewhere in the encounter.   OBJECTIVE:  BP (!) 112/58 (BP Location: Left Arm, Patient Position: Sitting, Cuff Size: Normal)   Pulse 82   Temp 97.6 F (36.4 C) (Oral)   Resp 16   Ht 6\' 4"  (1.93 m)   Wt 146 lb 3.2 oz (66.3 kg)   SpO2 98%   BMI 17.80 kg/m   Physical Exam  Constitutional: He appears well-developed. He is active and cooperative.  Non-toxic appearance.  Cardiovascular: Normal rate, regular rhythm, S1 normal, S2 normal, normal heart sounds, intact distal pulses and normal pulses.  Exam reveals no gallop and no friction rub.   No murmur heard. Pulmonary/Chest: Effort normal. No stridor. No tachypnea. No respiratory distress. He has no wheezes. He has no rales.    Abdominal: He exhibits no distension.  Musculoskeletal: He exhibits no edema.  Neurological: He is alert.  Skin: Skin is warm and dry. He is not diaphoretic. No pallor.  Vitals reviewed.    Lab Results  Component Value Date   WBC 3.5 12/20/2016   HGB 14.6 12/20/2016   HCT 45.0 12/20/2016   MCV 85 12/20/2016   PLT 227 12/20/2016    Lab Results  Component Value Date   CREATININE 1.23 12/20/2016   BUN 15 12/20/2016   NA 142 12/20/2016   K 4.0 12/20/2016   CL 100 12/20/2016   CO2 24 12/20/2016    Lab Results  Component Value Date   ALT 21 12/20/2016   AST 27 12/20/2016   ALKPHOS 61 12/20/2016   BILITOT 1.5 (H) 12/20/2016    Lab Results  Component Value Date   TSH 1.330 12/20/2016    Lab Results  Component Value Date   HGBA1C 6.0 (H) 12/20/2016    Lab Results  Component Value Date   CHOL 203 (H) 12/20/2016   HDL 93 12/20/2016   LDLCALC 100 (H) 12/20/2016   TRIG 48 12/20/2016   CHOLHDL 2.2 12/20/2016    The 95-AOZH10-year ASCVD risk score Denman George(Goff DC Jr., et al., 2013) is: 5.4%   Values used to calculate the score:     Age: [age] years     Sex: Male     Is Non-Hispanic African American: Yes  Diabetic: No     Tobacco smoker: No     Systolic Blood Pressure: 112 mmHg     Is BP treated: No     HDL Cholesterol: 93 mg/dL     Total Cholesterol: 203 mg/dL  Wt Readings from Last 3 Encounters:  06/16/17 146 lb 3.2 oz (66.3 kg)  12/20/16 147 lb (66.7 kg)  12/30/14 151 lb (68.5 kg)     No results found for this or any previous visit (from the past 72 hour(s)).  No results found.  ASSESSMENT AND PLAN:  Wessley was seen today for follow-up.  Diagnoses and all orders for this visit:  Elevated hemoglobin A1c -     POCT glycosylated hemoglobin (Hb A1C) -     Pneumococcal polysaccharide vaccine 23-valent greater than or equal to 2yo subcutaneous/IM    The patient is advised to call or return to clinic if he does not see an improvement in symptoms, or to seek  the care of the closest emergency department if he worsens with the above plan.   Deliah Boston, MHS, PA-C [facility] at [facility] 06/16/2017 12:14 PM

## 2018-04-19 ENCOUNTER — Ambulatory Visit (HOSPITAL_COMMUNITY)
Admission: EM | Admit: 2018-04-19 | Discharge: 2018-04-19 | Disposition: A | Discharge status: Home / Self Care | Payer: 59 | Attending: Family Medicine | Admitting: Family Medicine

## 2018-04-19 ENCOUNTER — Encounter (HOSPITAL_COMMUNITY): Payer: Self-pay | Admitting: Emergency Medicine

## 2018-04-19 DIAGNOSIS — R05 Cough: Secondary | ICD-10-CM | POA: Diagnosis not present

## 2018-04-19 DIAGNOSIS — R634 Abnormal weight loss: Secondary | ICD-10-CM

## 2018-04-19 DIAGNOSIS — R059 Cough, unspecified: Secondary | ICD-10-CM

## 2018-04-19 MED ORDER — AZITHROMYCIN 250 MG PO TABS: 250.0000 mg | ORAL_TABLET | Freq: Every day | ORAL | 0 refills | Status: DC

## 2018-04-19 MED ORDER — IPRATROPIUM BROMIDE 0.06 % NA SOLN: 2.0000 | Freq: Four times a day (QID) | NASAL | 0 refills | Status: DC

## 2018-04-19 MED ORDER — BENZONATATE 100 MG PO CAPS: 100.0000 mg | ORAL_CAPSULE | Freq: Three times a day (TID) | ORAL | 0 refills | Status: DC

## 2018-04-19 NOTE — ED Provider Notes (Signed)
[facility]    CSN: [id number] Arrival date & time: 04/19/18  1156     History   Chief Complaint Chief Complaint  Patient presents with  . URI    HPI Raymond Moon is a 62 y.o. male.   62 year old male comes in for 6 week history of URI symptoms. States started out with cough, congestion, rhinorrhea, throat irritation, which has slowly improved. He has residual cough and chest congestion. Denies fevers, night sweats. Has occasional chills. Denies chest pain, shortness of breath, wheezing, palpitations. Denies weakness, dizziness. States noticed weight loss of about 10 lb. He has no abdominal pain, nausea, vomiting. Still eating and drinking without difficulty. Denies hemoptysis, melena, hematochezia. Never smoker.      History reviewed. No pertinent past medical history.  Patient Active Problem List   Diagnosis Date Noted  . Prediabetes 12/23/2016    History reviewed. No pertinent surgical history.     Home Medications    Prior to Admission medications   Medication Sig Start Date End Date Taking? Authorizing Provider  azithromycin (ZITHROMAX) 250 MG tablet Take 1 tablet (250 mg total) by mouth daily. Take first 2 tablets together, then 1 every day until finished. 04/19/18   Cathie Hoops, Amy V, PA-C  benzonatate (TESSALON) 100 MG capsule Take 1 capsule (100 mg total) by mouth every 8 (eight) hours. 04/19/18   Cathie Hoops, Amy V, PA-C  ipratropium (ATROVENT) 0.06 % nasal spray Place 2 sprays into both nostrils 4 (four) times daily. 04/19/18   Belinda Fisher, PA-C    Family History No family history on file.  Social History Social History   Tobacco Use  . Smoking status: Never Smoker  . Smokeless tobacco: Never Used  Substance Use Topics  . Alcohol use: Yes    Alcohol/week: 0.0 standard drinks  . Drug use: No     Allergies   Patient has no known allergies.   Review of Systems Review of Systems  Reason unable to perform ROS: See HPI as above.     Physical Exam Triage  Vital Signs ED Triage Vitals [04/19/18 1228]  Enc Vitals Group     BP 139/82     Pulse Rate 99     Resp 16     Temp 97.8 F (36.6 C)     Temp src      SpO2 100 %     Weight      Height      Head Circumference      Peak Flow      Pain Score      Pain Loc      Pain Edu?      Excl. in GC?    No data found.  Updated Vital Signs BP 139/82   Pulse 99   Temp 97.8 F (36.6 C)   Resp 16   Wt 137 lb 6.4 oz (62.3 kg)   SpO2 100%   BMI 16.72 kg/m   Physical Exam  Constitutional: He is oriented to person, place, and time. He appears well-developed and well-nourished. No distress.  HENT:  Head: Normocephalic and atraumatic.  Right Ear: Tympanic membrane, external ear and ear canal normal. Tympanic membrane is not erythematous and not bulging.  Left Ear: Tympanic membrane, external ear and ear canal normal. Tympanic membrane is not erythematous and not bulging.  Nose: Nose normal. Right sinus exhibits no maxillary sinus tenderness and no frontal sinus tenderness. Left sinus exhibits no maxillary sinus tenderness and no frontal sinus  tenderness.  Mouth/Throat: Uvula is midline, oropharynx is clear and moist and mucous membranes are normal.  Eyes: Pupils are equal, round, and reactive to light. Conjunctivae are normal.  Neck: Normal range of motion. Neck supple.  Cardiovascular: Normal rate, regular rhythm and normal heart sounds. Exam reveals no gallop and no friction rub.  No murmur heard. Pulmonary/Chest: Effort normal and breath sounds normal. No stridor. No respiratory distress. He has no decreased breath sounds. He has no wheezes. He has no rhonchi. He has no rales.  Lymphadenopathy:    He has no cervical adenopathy.  Neurological: He is alert and oriented to person, place, and time.  Skin: Skin is warm and dry.  Psychiatric: He has a normal mood and affect. His behavior is normal. Judgment normal.     UC Treatments / Results  Labs (all labs ordered are listed, but only  abnormal results are displayed) Labs Reviewed - No data to display  EKG None  Radiology No results found.  Procedures Procedures (including critical care time)  Medications Ordered in UC Medications - No data to display  Initial Impression / Assessment and Plan / UC Course  I have reviewed the triage vital signs and the nursing notes.  Pertinent labs & imaging results that were available during my care of the patient were reviewed by me and considered in my medical decision making (see chart for details).    Exam unremarkable. Weight today 137lb, down from 146lb 05/2017. Discussed treating with azithromycin and follow up with PCP for further evaluation vs CXR in office today. Patient would like to defer CXR for now. Will have patient start azithromycin. Other symptomatic treatment discussed. Patient to follow up with PCP within the week for further evaluation needed. Patient expresses understanding and agrees to plan.  Final Clinical Impressions(s) / UC Diagnoses   Final diagnoses:  Cough  Loss of weight    ED Prescriptions    Medication Sig Dispense Auth. Provider   azithromycin (ZITHROMAX) 250 MG tablet Take 1 tablet (250 mg total) by mouth daily. Take first 2 tablets together, then 1 every day until finished. 6 tablet Yu, Amy V, PA-C   benzonatate (TESSALON) 100 MG capsule Take 1 capsule (100 mg total) by mouth every 8 (eight) hours. 21 capsule Yu, Amy V, PA-C   ipratropium (ATROVENT) 0.06 % nasal spray Place 2 sprays into both nostrils 4 (four) times daily. 15 mL Threasa Alpha, [place] 04/19/18 1409

## 2018-04-19 NOTE — Discharge Instructions (Signed)
Start azithromycin as directed. Tessalon for cough. Start atrovent nasal spray for nasal congestion/drainage. You can use over the counter nasal saline rinse such as neti pot for nasal congestion. Keep hydrated, your urine should be clear to pale yellow in color. Tylenol/motrin for fever and pain. Monitor for any worsening of symptoms, chest pain, shortness of breath, wheezing, swelling of the throat, follow up for reevaluation.   Please make a PCP appointment for 1 week. If symptoms not improving, or continues with weight loss, please follow up for further evaluation and management needed.

## 2018-04-19 NOTE — ED Triage Notes (Signed)
Pt c/o cold symptoms, states chest congestion, coughing, states hes been feeling bad for the last few weeks.

## 2018-04-29 ENCOUNTER — Emergency Department (HOSPITAL_COMMUNITY): Payer: 59

## 2018-04-29 ENCOUNTER — Other Ambulatory Visit: Payer: Self-pay

## 2018-04-29 ENCOUNTER — Ambulatory Visit (INDEPENDENT_AMBULATORY_CARE_PROVIDER_SITE_OTHER): Payer: 59

## 2018-04-29 ENCOUNTER — Emergency Department (HOSPITAL_COMMUNITY)
Admission: EM | Admit: 2018-04-29 | Discharge: 2018-04-29 | Disposition: A | Discharge status: Home / Self Care | Payer: 59 | Attending: Emergency Medicine | Admitting: Emergency Medicine

## 2018-04-29 ENCOUNTER — Encounter (HOSPITAL_COMMUNITY): Payer: Self-pay | Admitting: *Deleted

## 2018-04-29 ENCOUNTER — Ambulatory Visit (INDEPENDENT_AMBULATORY_CARE_PROVIDER_SITE_OTHER): Payer: 59 | Admitting: Emergency Medicine

## 2018-04-29 ENCOUNTER — Encounter: Payer: Self-pay | Admitting: Emergency Medicine

## 2018-04-29 VITALS — BP 120/76 | HR 97 | Temp 98.2°F | Resp 16 | Ht 75.5 in | Wt 134.4 lb

## 2018-04-29 DIAGNOSIS — R059 Cough, unspecified: Secondary | ICD-10-CM

## 2018-04-29 DIAGNOSIS — J181 Lobar pneumonia, unspecified organism: Secondary | ICD-10-CM

## 2018-04-29 DIAGNOSIS — R05 Cough: Secondary | ICD-10-CM

## 2018-04-29 DIAGNOSIS — R634 Abnormal weight loss: Secondary | ICD-10-CM

## 2018-04-29 DIAGNOSIS — J439 Emphysema, unspecified: Secondary | ICD-10-CM | POA: Insufficient documentation

## 2018-04-29 DIAGNOSIS — J189 Pneumonia, unspecified organism: Secondary | ICD-10-CM

## 2018-04-29 LAB — BASIC METABOLIC PANEL
Anion gap: 9 (ref 5–15)
BUN: 15 mg/dL (ref 8–23)
CO2: 27 mmol/L (ref 22–32)
Calcium: 9.2 mg/dL (ref 8.9–10.3)
Chloride: 102 mmol/L (ref 98–111)
Creatinine, Ser: 1.09 mg/dL (ref 0.61–1.24)
GFR calc Af Amer: >60 mL/min (ref >60)
GFR calc non Af Amer: >60 mL/min (ref >60)
GLUCOSE: 76 mg/dL (ref 70–99)
POTASSIUM: 4.5 mmol/L (ref 3.5–5.1)
Sodium: 138 mmol/L (ref 135–145)

## 2018-04-29 LAB — COMPREHENSIVE METABOLIC PANEL
A/G RATIO: 0.7 — AB (ref 1.2–2.2)
ALK PHOS: 65 IU/L (ref 39–117)
ALT: 13 IU/L (ref 0–44)
AST: 20 IU/L (ref 0–40)
Albumin: 3.3 g/dL — ABNORMAL LOW (ref 3.6–4.8)
BILIRUBIN TOTAL: 0.4 mg/dL (ref 0.0–1.2)
BUN / CREAT RATIO: 15 (ref 10–24)
BUN: 17 mg/dL (ref 8–27)
CHLORIDE: 100 mmol/L (ref 96–106)
CO2: 27 mmol/L (ref 20–29)
Calcium: 9.3 mg/dL (ref 8.6–10.2)
Creatinine, Ser: 1.12 mg/dL (ref 0.76–1.27)
GFR calc Af Amer: 82 mL/min/{1.73_m2} (ref >59)
GFR calc non Af Amer: 71 mL/min/{1.73_m2} (ref >59)
Globulin, Total: 4.8 g/dL — ABNORMAL HIGH (ref 1.5–4.5)
Glucose: 71 mg/dL (ref 65–99)
POTASSIUM: 4.4 mmol/L (ref 3.5–5.2)
Sodium: 140 mmol/L (ref 134–144)
Total Protein: 8.1 g/dL (ref 6.0–8.5)

## 2018-04-29 LAB — POCT CBC
GRANULOCYTE PERCENT: 80.2 % — AB (ref 37–80)
HEMATOCRIT: 37.6 % — AB (ref 43.5–53.7)
HEMOGLOBIN: 12.2 g/dL — AB (ref 14.1–18.1)
Lymph, poc: 0.8 (ref 0.6–3.4)
MCH: 25.8 pg — AB (ref 27–31.2)
MCHC: 32.4 g/dL (ref 31.8–35.4)
MCV: 79.6 fL — AB (ref 80–97)
MID (cbc): 0.6 (ref 0–0.9)
MPV: 6.9 fL (ref 0–99.8)
POC GRANULOCYTE: 5.5 (ref 2–6.9)
POC LYMPH PERCENT: 11.6 %L (ref 10–50)
POC MID %: 8.2 % (ref 0–12)
Platelet Count, POC: 375 10*3/uL (ref 142–424)
RBC: 4.72 M/uL (ref 4.69–6.13)
RDW, POC: 15.8 %
WBC: 6.9 10*3/uL (ref 4.6–10.2)

## 2018-04-29 LAB — CBC
HCT: 41.6 % (ref 39.0–52.0)
HEMOGLOBIN: 12 g/dL — AB (ref 13.0–17.0)
MCH: 25.2 pg — AB (ref 26.0–34.0)
MCHC: 28.8 g/dL — ABNORMAL LOW (ref 30.0–36.0)
MCV: 87.4 fL (ref 78.0–100.0)
Platelets: 329 10*3/uL (ref 150–400)
RBC: 4.76 MIL/uL (ref 4.22–5.81)
RDW: 14.5 % (ref 11.5–15.5)
WBC: 8.1 10*3/uL (ref 4.0–10.5)

## 2018-04-29 LAB — RAPID HIV SCREEN (HIV 1/2 AB+AG)
HIV 1/2 Antibodies: NONREACTIVE
HIV-1 P24 Antigen - HIV24: NONREACTIVE

## 2018-04-29 MED ORDER — IOHEXOL 300 MG/ML  SOLN
75.0000 mL | Freq: Once | INTRAMUSCULAR | Status: AC | PRN
  Administered 2018-04-29: 75 mL via INTRAVENOUS

## 2018-04-29 MED ORDER — LEVOFLOXACIN 500 MG PO TABS: 500.0000 mg | ORAL_TABLET | Freq: Every day | ORAL | 0 refills | Status: AC

## 2018-04-29 NOTE — ED Notes (Signed)
Negative pressure room activated. Airborne precautions initiated and maintained. Staff aware.

## 2018-04-29 NOTE — ED Provider Notes (Signed)
Raymond Moon [facility] EMERGENCY DEPARTMENT Provider Note   CSN: [id number] Arrival date & time: 04/29/18  1234     History   Chief Complaint Chief Complaint  Patient presents with  . Cough    HPI Raymond Moon is a 62 y.o. male.  HPI   Patient is a 62 year old male with no significant past medical history presents emergency department today for evaluation of a cough that has been present for the last 6 to 7 weeks.  Patient states that when he started to have a cough he initially had chills which she states have since resolved.  Denies night sweats or continued fevers.  States initially his cough was worse and had been productive of sputum.  Reports initially had brown/red sputum production, however this has somewhat improved and now his sputum is mostly white.  He also reports improvement of his cough after taking azithromycin.  States that he notices his cough is worse more in the morning when he wakes up.  Reports a lot of postnasal drainage.  States he just ended azithromycin about 3 to 4 days ago.  He denies shortness of breath or chest pain.  No bilateral lower extremity swelling.  No hemoptysis.  No recent travels, surgeries or hospital admissions.  No history of cancer or PE/DVT.  Denies any recent sick contacts.  Denies any history of tobacco use or secondhand smoke exposure. Works in a post office.  Pt was seen by his PCP PTA who sent him to the ED for due to concerns for TB. Per PCP note, pt has had persistent cough for 6-7 weeks with red/brown phlegm. Cough has been productive of brownish/red mucus.  He initially had chills when the cough started several weeks ago.  He has been on a course of azithromycin, Tessalon and Atrovent with little relief.  Also reports recent 10 pound weight loss in the last 2-3 weeks. patient is not a smoker.  He has no history of asthma or COPD.  Follow-up does not utilize alcohol does not have any medical problems.  Patient was born and raised  in [place]. Pt sent here by pcp due to concern for possible TB.  History reviewed. No pertinent past medical history.  Patient Active Problem List   Diagnosis Date Noted  . Cough 04/29/2018  . Loss of weight 04/29/2018  . Pneumonia of left upper lobe due to infectious organism (HCC) 04/29/2018  . Bullous emphysema (HCC) 04/29/2018  . Prediabetes 12/23/2016    History reviewed. No pertinent surgical history.      Home Medications    Prior to Admission medications   Medication Sig Start Date End Date Taking? Authorizing Provider  azithromycin (ZITHROMAX) 250 MG tablet Take 1 tablet (250 mg total) by mouth daily. Take first 2 tablets together, then 1 every day until finished. Patient not taking: Reported on 04/29/2018 04/19/18   Belinda Fisher, PA-C  benzonatate (TESSALON) 100 MG capsule Take 1 capsule (100 mg total) by mouth every 8 (eight) hours. Patient not taking: Reported on 04/29/2018 04/19/18   Belinda Fisher, PA-C  ipratropium (ATROVENT) 0.06 % nasal spray Place 2 sprays into both nostrils 4 (four) times daily. 04/19/18   Cathie Hoops, Amy V, PA-C  levofloxacin (LEVAQUIN) 500 MG tablet Take 1 tablet (500 mg total) by mouth daily for 7 days. 04/29/18 05/06/18  Fanchon Papania S, PA-C    Family History History reviewed. No pertinent family history.  Social History Social History   Tobacco Use  . Smoking status:  Never Smoker  . Smokeless tobacco: Never Used  Substance Use Topics  . Alcohol use: Yes    Alcohol/week: 0.0 standard drinks  . Drug use: No     Allergies   Patient has no known allergies.   Review of Systems Review of Systems  Constitutional: Negative for chills and fever.  HENT: Positive for postnasal drip. Negative for congestion, ear pain, rhinorrhea and sore throat.   Eyes: Negative for pain and visual disturbance.  Respiratory: Positive for cough. Negative for shortness of breath and wheezing.   Cardiovascular: Negative for chest pain, palpitations and leg swelling.    Gastrointestinal: Negative for abdominal pain, constipation, diarrhea, nausea and vomiting.  Genitourinary: Negative for dysuria and hematuria.  Musculoskeletal: Negative for back pain.  Skin: Negative for rash.  Neurological: Negative for headaches.  All other systems reviewed and are negative.    Physical Exam Updated Vital Signs BP 128/74 (BP Location: Right Arm)   Pulse 100   Temp 98.1 F (36.7 C) (Oral)   Resp (!) 23   SpO2 95%   Physical Exam  Constitutional: He appears well-developed and well-nourished. No distress.  HENT:  Head: Normocephalic and atraumatic.  Eyes: Conjunctivae are normal.  Neck: Neck supple.  Cardiovascular: Normal rate, regular rhythm and normal heart sounds.  No murmur heard. Pulmonary/Chest: Effort normal.  No tachypnea.  Speaking full sentences.  Crackles to bilateral upper lung fields with wheezing on the right upper lungs.  Abdominal: Soft. Bowel sounds are normal. He exhibits no distension. There is no tenderness. There is no guarding.  Musculoskeletal: Normal range of motion.  Neurological: He is alert.  Skin: Skin is warm and dry. Capillary refill takes less than 2 seconds.  Psychiatric: He has a normal mood and affect.  Nursing note and vitals reviewed.  ED Treatments / Results  Labs (all labs ordered are listed, but only abnormal results are displayed) Labs Reviewed  CBC - Abnormal; Notable for the following components:      Result Value   Hemoglobin 12.0 (*)    MCH 25.2 (*)    MCHC 28.8 (*)    All other components within normal limits  ACID FAST SMEAR (AFB)  ACID FAST CULTURE WITH REFLEXED SENSITIVITIES  BASIC METABOLIC PANEL  RAPID HIV SCREEN (HIV 1/2 AB+AG)  QUANTIFERON-TB GOLD PLUS    EKG None  Radiology Dg Chest 2 View  Result Date: 04/29/2018 CLINICAL DATA:  sent by [facility] to rule out TB EXAM: CHEST - 2 VIEW COMPARISON:  04/29/2018 FINDINGS: Stable extensive densities along the lung apices including some bilateral  upward hilar retraction and left greater than right suprahilar densities. Large lung volumes. Scattered scarring in both lungs. Blunting of the posterior costophrenic angles. Large lung volumes. Heart size within normal limits. IMPRESSION: 1. Biapical marked pleural thickening and irregular opacities, not appreciably changed from the earlier exam, but mildly increased from 11/01/2014. Appearance could be from pneumoconiosis with progressive massive fibrosis, scarring related to tuberculosis/old granulomatous disease is not readily excluded. Given the increase since 2016, I would recommend chest CT is a baseline and to help exclude any active process adjacent to the pleuroparenchymal scarring in the apices. 2. Scarring in the bilateral mid lungs. 3.  Emphysema (ICD10-J43.9). Electronically Signed   By: Gaylyn Rong M.D.   On: 04/29/2018 14:07   Dg Chest 2 View  Result Date: 04/29/2018 CLINICAL DATA:  62 year old with chronic persistent cough and recent unexplained weight loss. EXAM: CHEST - 2 VIEW COMPARISON:  11/01/2014, 07/25/2006. FINDINGS:  Cardiomediastinal silhouette unremarkable, unchanged. Severe bullous emphysematous changes throughout both lungs, unchanged. Extensive biapical pleuroparenchymal scarring and moderate pleuroparenchymal scarring at the bases, unchanged. New patchy and streaky airspace opacities in the LEFT UPPER LOBE. No new pulmonary parenchymal abnormalities elsewhere. IMPRESSION: 1. LEFT UPPER LOBE pneumonia. 2.  Emphysema. (ICD10-J43.9) 3. Chronic biapical and bibasilar pleuroparenchymal scarring. Electronically Signed   By: Hulan Saas M.D.   On: 04/29/2018 09:44   Ct Chest W Contrast  Result Date: 04/29/2018 CLINICAL DATA:  Nectar cough for 6 weeks, weight loss, question TB, history of bullous emphysema EXAM: CT CHEST WITH CONTRAST TECHNIQUE: Multidetector CT imaging of the chest was performed during intravenous contrast administration. Sagittal and coronal MPR images  reconstructed from axial data set. CONTRAST:  75mL OMNIPAQUE IOHEXOL 300 MG/ML  SOLN IV. COMPARISON:  Chest radiograph 04/29/2018 FINDINGS: Cardiovascular: Aorta normal caliber. Thoracic vascular structures patent and unremarkable. Heart normal appearance. No pericardial effusion. Mediastinum/Nodes: Esophagus normal appearance. Base of cervical region normal appearance. Small calcified precarinal and RIGHT hilar lymph nodes. No adenopathy. Lungs/Pleura: Bibasilar scarring. Tree-in-bud reticulonodular infiltrates in the LEFT lower lobe and to a lesser degree LEFT upper lobe adjacent to the major fissure either representing bronchiolitis or infection. Central peribronchial thickening. Extensive pleuroparenchymal opacities at both lung apices with multiple cavitary foci particularly in LEFT upper lobe. A dominant cavity in the LEFT upper lobe measures 5.1 x 2.5 x 2.7 cm and contains central internal material which could represent mycetoma. Parenchymal calcification at RIGHT apex. No consolidation at posterior aspect of LEFT upper lobe. Minimal patchy infiltrate RIGHT upper lobe laterally. Minimal bronchiectasis LEFT upper lobe. Scattered pleural calcification in the hemithoraces bilaterally which could be related to asbestos exposure, TB, or prior empyema/pneumothorax. Posterior pleural thickening at the lung bases. No dominant pulmonary mass. Lungs demonstrate panlobular emphysema. Upper Abdomen: Small hepatic cysts. Remaining visualized upper abdomen unremarkable. Musculoskeletal: No acute osseous findings. IMPRESSION: Pleural calcifications bilaterally which could be related to asbestos exposure TB, less likely prior empyema or hemothorax bilaterally. Extensive postinflammatory scarring at the upper lobes with cavitation and minimal calcification, could reflect a pneumoconiosis or prior infection such as TB. Dominant cavitation in the LEFT upper lobe 5.1 x 2.5 x 2.7 cm contains nodular internal material question  mycetoma/superimposed fungal infection. Additional reticulonodular tree-in-bud opacities in the LEFT lower lobe, less in BILATERAL upper lobes, question bronchiolitis or infection. Emphysema (ICD10-J43.9). Electronically Signed   By: Ulyses Southward M.D.   On: 04/29/2018 20:34    Procedures Procedures (including critical care time)  Medications Ordered in ED Medications  iohexol (OMNIPAQUE) 300 MG/ML solution 75 mL (75 mLs Intravenous Contrast Given 04/29/18 1946)     Initial Impression / Assessment and Plan / ED Course  I have reviewed the triage vital signs and the nursing notes.  Pertinent labs & imaging results that were available during my care of the patient were reviewed by me and considered in my medical decision making (see chart for details).  Discussed pt presentation and exam findings with Dr. Estell Harpin, who agrees with the plan follow recommendations from pulmonology.   8:53 PM CONSULT with  Dr. Sung Amabile with pulmonology who reviewed the CT scan findings.  He recommended collecting sputum AFB and QuantiFERON gold test.  He recommended treating the patient with 500 mg of levofloxacin for the next 7 days and having him follow-up in the office in the next few days.  He stated that if patient does not hear from lumbar pulmonology by the end of the day tomorrow  that he should call them to make an appointment.  Final Clinical Impressions(s) / ED Diagnoses   Final diagnoses:  Cough   Patient presented to ED today with complaints of a cough with sputum production for the last 6 to 7 weeks.  Also associated with weight loss.  No significant fevers or chills or systemic symptoms.  No chest pain or shortness of breath.  No risk factors for PE.  Patient grew up in [place] and was seen by PCP who had concern for possible TB.  He was sent to the ED for rule out of this.  Vital signs have remained stable throughout his visit in the ED.  Lab work is benign.  No leukocytosis.  No anemia.  Electrolytes  and kidney function is within normal limits.  CXR with biapical marked pleural thickening and irregular opacities, not appreciably changed from the earlier exam, but mildly increased from 11/01/2014. Appearance could be from pneumoconiosis with progressive massive fibrosis, scarring related to tuberculosis/old granulomatous disease is not readily excluded. CT was recommended.   CT chest with contrast was ordered which showed pleural calcifications bilaterally which could be related to asbestos exposure or TB, felt to be less likely to prior empyema or hemothorax bilaterally.  Has extensive postinflammatory scarring at the upper lobes with cavitation minimal calcification which could reflect pneumoconiosis or prior infection such as TB.  Dominant cavitation in the left upper lobe with nodule letter internal material question mycetoma/superimposed fungal infection.  Additional reticulonodular tree-in-bud opacities in the LEFT lower lobe, less in BILATERAL upper lobes, question bronchiolitis or infection.  These findings were discussed with Dr. Sung Amabile with pulmonology who reviewed the CT scan findings.  He recommended collecting sputum AFB and QuantiFERON gold test.  He recommended treating the patient with 500 mg of levofloxacin for the next 7 days and having him follow-up in the office in the next few days.  He stated that if patient does not hear from lumbar pulmonology by the end of the day tomorrow that he should call them to make an appointment.  Findings of the work-up were discussed with the patient.  He was given information to follow-up with [facility] pulmonology later this week.  He was given strict return precautions for any new or worsening symptoms in the meantime.  He voiced an understanding of plan reasons to return.  All questions answered  ED Discharge Orders         Ordered    levofloxacin (LEVAQUIN) 500 MG tablet  Daily     04/29/18 2257           Karrie Meres,  PA-C 04/29/18 2312    Bethann Berkshire, MD 05/01/18 1223

## 2018-04-29 NOTE — ED Notes (Signed)
Phlebotomy notified to redraw light green tube

## 2018-04-29 NOTE — ED Triage Notes (Signed)
Pt sent here by pcp. Having persistent cough x 6-7 weeks with recent weight loss and brownish-red sputum. Had abnormal chest xray and sent here to r/o TB. Mask on pt on arrival.

## 2018-04-29 NOTE — Discharge Instructions (Addendum)
You were given a prescription for antibiotics. Please take the antibiotic prescription fully.   Today your chest CT had findings on it that were concerning for a lung infection that is possible to be tuberculosis.  You will need to follow-up with [place] pulmonology in the next few days for reevaluation.  The clinic should contact you by phone to schedule an appointment for follow-up.  If the clinic does not call you by tomorrow afternoon, then you need to call the office to schedule an appointment later this week.  The phone number for the clinic is listed on your discharge paperwork.  Please be mindful and wear a mask when you are out in public to prevent the spread of infection.  If you have any new or worsening symptoms in the meantime including fevers, chills, shortness of breath, chest pain please return to the emergency department immediately.

## 2018-04-29 NOTE — ED Notes (Signed)
IV team bedside. 

## 2018-04-29 NOTE — ED Notes (Signed)
Pt. To CT via stretcher. 

## 2018-04-29 NOTE — Progress Notes (Signed)
Raymond Moon 62 y.o.   Chief Complaint  Patient presents with  . Cough    x 7 weeks productive brownish red mucus  . chest congestion    x 7 week and weight loss of 10 pounds    HISTORY OF PRESENT ILLNESS: This is a 62 y.o. male complaining of persistent cough for the past 6 to 7 weeks.  Has seen intermittent brownish-red phlegm.  Has lost 10 pounds in the past 2 to 3 weeks.  Had chills the first 2 weeks when it started.  Recently went to urgent care clinic and was started on azithromycin, Tessalon, and Atrovent nasal spray.  Little relief.  Symptoms still persist.  Non-smoker.  No history of asthma or COPD.  Non-EtOH abuser.  No chronic medical problems.  Recent traveling was to [place] last February.  No other significant associated symptoms.  Born and raised in [place].  Moved to the [place] in his 89s.  HPI   Prior to Admission medications   Medication Sig Start Date End Date Taking? Authorizing Provider  ipratropium (ATROVENT) 0.06 % nasal spray Place 2 sprays into both nostrils 4 (four) times daily. 04/19/18  Yes Yu, Amy V, PA-C  azithromycin (ZITHROMAX) 250 MG tablet Take 1 tablet (250 mg total) by mouth daily. Take first 2 tablets together, then 1 every day until finished. Patient not taking: Reported on 04/29/2018 04/19/18   Belinda Fisher, PA-C  benzonatate (TESSALON) 100 MG capsule Take 1 capsule (100 mg total) by mouth every 8 (eight) hours. Patient not taking: Reported on 04/29/2018 04/19/18   Belinda Fisher, PA-C    No Known Allergies  Patient Active Problem List   Diagnosis Date Noted  . Prediabetes 12/23/2016    No past medical history on file.  No past surgical history on file.  Social History   Socioeconomic History  . Marital status: Married    Spouse name: Not on file  . Number of children: Not on file  . Years of education: Not on file  . Highest education level: Not on file  Occupational History  . Not on file  Social Needs  . Financial resource strain: Not on file  .  Food insecurity:    Worry: Not on file    Inability: Not on file  . Transportation needs:    Medical: Not on file    Non-medical: Not on file  Tobacco Use  . Smoking status: Never Smoker  . Smokeless tobacco: Never Used  Substance and Sexual Activity  . Alcohol use: Yes    Alcohol/week: 0.0 standard drinks  . Drug use: No  . Sexual activity: Yes    Partners: Male  Lifestyle  . Physical activity:    Days per week: Not on file    Minutes per session: Not on file  . Stress: Not on file  Relationships  . Social connections:    Talks on phone: Not on file    Gets together: Not on file    Attends religious service: Not on file    Active member of club or organization: Not on file    Attends meetings of clubs or organizations: Not on file    Relationship status: Not on file  . Intimate partner violence:    Fear of current or ex partner: Not on file    Emotionally abused: Not on file    Physically abused: Not on file    Forced sexual activity: Not on file  Other Topics Concern  .  Not on file  Social History Narrative  . Not on file    No family history on file.   Review of Systems  Constitutional: Positive for chills and weight loss.  HENT: Negative.  Negative for hearing loss and sore throat.   Eyes: Negative.  Negative for blurred vision and double vision.  Respiratory: Positive for cough. Negative for hemoptysis, shortness of breath and wheezing.   Cardiovascular: Negative.  Negative for chest pain and palpitations.  Gastrointestinal: Negative for abdominal pain, diarrhea, nausea and vomiting.  Genitourinary: Negative.  Negative for dysuria and hematuria.  Musculoskeletal: Negative.  Negative for back pain, myalgias and neck pain.  Skin: Negative.  Negative for rash.  Neurological: Negative.  Negative for dizziness and headaches.  Endo/Heme/Allergies: Negative.   All other systems reviewed and are negative.  Vitals:   04/29/18 0845  BP: 120/76  Pulse: 97  Resp:  16  Temp: 98.2 F (36.8 C)  SpO2: 97%    Physical Exam  Constitutional: He is oriented to person, place, and time. He appears well-developed and well-nourished.  HENT:  Head: Normocephalic and atraumatic.  Nose: Nose normal.  Mouth/Throat: Oropharynx is clear and moist.  Eyes: Pupils are equal, round, and reactive to light. Conjunctivae and EOM are normal.  Neck: Normal range of motion. Neck supple. No JVD present.  Cardiovascular: Normal rate and regular rhythm.  Pulmonary/Chest: Effort normal and breath sounds normal.  Abdominal: Soft. Bowel sounds are normal. He exhibits no distension. There is no tenderness.  Musculoskeletal: Normal range of motion. He exhibits no edema or tenderness.  Lymphadenopathy:    He has no cervical adenopathy.  Neurological: He is alert and oriented to person, place, and time. No sensory deficit. He exhibits normal muscle tone. Coordination normal.  Skin: Skin is warm and dry. Capillary refill takes less than 2 seconds.  Psychiatric: He has a normal mood and affect. His behavior is normal.  Vitals reviewed.  Dg Chest 2 View  Result Date: 04/29/2018 CLINICAL DATA:  62 year old with chronic persistent cough and recent unexplained weight loss. EXAM: CHEST - 2 VIEW COMPARISON:  11/01/2014, 07/25/2006. FINDINGS: Cardiomediastinal silhouette unremarkable, unchanged. Severe bullous emphysematous changes throughout both lungs, unchanged. Extensive biapical pleuroparenchymal scarring and moderate pleuroparenchymal scarring at the bases, unchanged. New patchy and streaky airspace opacities in the LEFT UPPER LOBE. No new pulmonary parenchymal abnormalities elsewhere. IMPRESSION: 1. LEFT UPPER LOBE pneumonia. 2.  Emphysema. (ICD10-J43.9) 3. Chronic biapical and bibasilar pleuroparenchymal scarring. Electronically Signed   By: Hulan Saas M.D.   On: 04/29/2018 09:44   Results for orders placed or performed in visit on 04/29/18 (from the past 24 hour(s))  POCT CBC      Status: Abnormal   Collection Time: 04/29/18  9:24 AM  Result Value Ref Range   WBC 6.9 4.6 - 10.2 K/uL   Lymph, poc 0.8 0.6 - 3.4   POC LYMPH PERCENT 11.6 10 - 50 %L   MID (cbc) 0.6 0 - 0.9   POC MID % 8.2 0 - 12 %M   POC Granulocyte 5.5 2 - 6.9   Granulocyte percent 80.2 (A) 37 - 80 %G   RBC 4.72 4.69 - 6.13 M/uL   Hemoglobin 12.2 (A) 14.1 - 18.1 g/dL   HCT, POC 62.9 (A) 52.8 - 53.7 %   MCV 79.6 (A) 80 - 97 fL   MCH, POC 25.8 (A) 27 - 31.2 pg   MCHC 32.4 31.8 - 35.4 g/dL   RDW, POC 41.3 %  Platelet Count, POC 375 142 - 424 K/uL   MPV 6.9 0 - 99.8 fL   A total of 40 minutes was spent in the room with the patient, greater than 50% of which was in counseling/coordination of care regarding differential diagnosis, treatment, need for further diagnostic testing and need to go to the emergency room today.   ASSESSMENT & PLAN: [place]art was seen today for cough and chest congestion.  Diagnoses and all orders for this visit:  Cough -     POCT CBC -     Comprehensive metabolic panel -     DG Chest 2 View; Future  Loss of weight -     POCT CBC -     Comprehensive metabolic panel -     DG Chest 2 View; Future  Pneumonia of left upper lobe due to infectious organism [facility]) Comments: Rule out TB  Bullous emphysema (HCC)   Must go to the emergency room now for further evaluation and treatment of possible TB.   Edwina Barth, MD [facility]

## 2018-04-29 NOTE — Patient Instructions (Addendum)
Need to go to the emergency department now for further evaluation and treatment.  Rule out TB. Community-Acquired Pneumonia, Adult Pneumonia is an infection of the lungs. One type of pneumonia can happen while a person is in a hospital. A different type can happen when a person is not in a hospital (community-acquired pneumonia). It is easy for this kind to spread from person to person. It can spread to you if you breathe near an infected person who coughs or sneezes. Some symptoms include:  A dry cough.  A wet (productive) cough.  Fever.  Sweating.  Chest pain.  Follow these instructions at home:  Take over-the-counter and prescription medicines only as told by your doctor. ? Only take cough medicine if you are losing sleep. ? If you were prescribed an antibiotic medicine, take it as told by your doctor. Do not stop taking the antibiotic even if you start to feel better.  Sleep with your head and neck raised (elevated). You can do this by putting a few pillows under your head, or you can sleep in a recliner.  Do not use tobacco products. These include cigarettes, chewing tobacco, and e-cigarettes. If you need help quitting, ask your doctor.  Drink enough water to keep your pee (urine) clear or pale yellow. A shot (vaccine) can help prevent pneumonia. Shots are often suggested for:  People older than 61 years of age.  People older than 61 years of age: ? Who are having cancer treatment. ? Who have long-term (chronic) lung disease. ? Who have problems with their body's defense system (immune system).  You may also prevent pneumonia if you take these actions:  Get the flu (influenza) shot every year.  Go to the dentist as often as told.  Wash your hands often. If soap and water are not available, use hand sanitizer.  Contact a doctor if:  You have a fever.  You lose sleep because your cough medicine does not help. Get help right away if:  You are short of breath and  it gets worse.  You have more chest pain.  Your sickness gets worse. This is very serious if: ? You are an older adult. ? Your body's defense system is weak.  You cough up blood. This information is not intended to replace advice given to you by your health care provider. Make sure you discuss any questions you have with your health care provider. Document Released: 01/22/2008 Document Revised: 01/11/2016 Document Reviewed: 11/30/2014 Elsevier Interactive Patient Education  [employer].   If you have lab work done today you will be contacted with your lab results within the next 2 weeks.  If you have not heard from [place] then please contact us. The fastest way to get your results is to register for My Chart.   IF you received an x-ray today, you will receive an invoice from [facility]. Please contact [facility] at [phone] with questions or concerns regarding your invoice.   IF you received labwork today, you will receive an invoice from [place]. Please contact [facility] at [phone] with questions or concerns regarding your invoice.   Our billing staff will not be able to assist you with questions regarding bills from these companies.  You will be contacted with the lab results as soon as they are available. The fastest way to get your results is to activate your My Chart account. Instructions are located on the last page of this paperwork. If you have not heard from [place]  regarding the results in 2 weeks, please contact this office.

## 2018-05-01 LAB — ACID FAST SMEAR (AFB, MYCOBACTERIA): Acid Fast Smear: NEGATIVE

## 2018-05-04 LAB — QUANTIFERON-TB GOLD PLUS: QUANTIFERON-TB GOLD PLUS: NEGATIVE

## 2018-05-04 LAB — QUANTIFERON-TB GOLD PLUS (RQFGPL)
QUANTIFERON TB1 AG VALUE: 0.32 [IU]/mL
QuantiFERON Mitogen Value: 4.83 IU/mL
QuantiFERON Nil Value: 0.05 IU/mL
QuantiFERON TB2 Ag Value: 0.34 IU/mL

## 2018-05-07 ENCOUNTER — Encounter: Payer: Self-pay | Admitting: Emergency Medicine

## 2018-05-07 ENCOUNTER — Ambulatory Visit (INDEPENDENT_AMBULATORY_CARE_PROVIDER_SITE_OTHER): Payer: 59 | Admitting: Emergency Medicine

## 2018-05-07 VITALS — BP 120/72 | HR 90 | Ht 76.0 in | Wt 137.0 lb

## 2018-05-07 DIAGNOSIS — J181 Lobar pneumonia, unspecified organism: Secondary | ICD-10-CM | POA: Diagnosis not present

## 2018-05-07 DIAGNOSIS — J189 Pneumonia, unspecified organism: Secondary | ICD-10-CM

## 2018-05-07 NOTE — Assessment & Plan Note (Signed)
Significant left upper lobe and also some scattered multi lobar inflammatory change.  The left upper lobe has a thick-walled cavitary lesion with some central material either necrosis, possibly mycetoma (although looks like the former).  He was evaluated for tuberculosis had a negative QuantiFERON gold, has had 3 total negative AFB sputum.  He is supposed to do one more for the health department.  He significantly clinically improved with levofloxacin.  In fact he feels back to baseline.  Based on this I think it would be reasonable to follow him clinically and then radiographically after about 2 months to see if he has had improvement in his left upper lobe lesion.  I am concerned based on the short antibiotic course that he may be harboring some degree of infection and his cavitary lesion and he may relapse.  If so I will see him back sooner and we will reinitiate antibiotics, probably with some anaerobic coverage.  He will be reimaged in 2 months or sooner if he does relapse.  If his CT scan remains abnormal then I would favor bronchoscopy to ensure no other left upper lobe process.

## 2018-05-07 NOTE — Patient Instructions (Signed)
We will plan to repeat your CT scan of the chest in early November to follow your left upper lobe cavitary pneumonia. We will follow-up in office after that scan to review it together. If you develop any change in your symptoms including increased cough, increased mucus production, brown or red mucus, chest pain or shortness of breath then please call our office and make an appointment to see Dr. Delton CoombesByrum sooner. Depending on the results of your scan and your symptoms we will decide whether you need any further testing including possible bronchoscopy.

## 2018-05-07 NOTE — Progress Notes (Signed)
Subjective:    Patient ID: Raymond Moon, male    DOB: Mar 10, 1956, 62 y.o.   MRN: [id number]  HPI 62 year old never smoker, native of [place], with little past medical history.  He developed persistent cough about 2 months ago with intermittent brownish to red mucus.  He had chills, no overt fever. He has had some concomitant weight loss of about 10 pounds over the last month. He was seen by PCP and then in the ED on 9/11. CXR and CT chest performed and reviewed by me, show a predominant cavitary left apical lesion with thick walls and some central material that could reflect necrosis versus mycetoma.  He was treated with levofloxacin at that time and his clinically improved significantly.  He no longer has cough, he feels "back to normal".  Quant gold negative, AFB on sputum negative x1 as well. He has been asked by the health department to provide repeat AFB' > tells me that he has 2 of 3 done (both negative)   He travelled to [place] in February. Felt well after returning.    Review of Systems  No past medical history on file.   No family history on file.   Social History   Socioeconomic History  . Marital status: Married    Spouse name: Not on file  . Number of children: Not on file  . Years of education: Not on file  . Highest education level: Not on file  Occupational History  . Not on file  Social Needs  . Financial resource strain: Not on file  . Food insecurity:    Worry: Not on file    Inability: Not on file  . Transportation needs:    Medical: Not on file    Non-medical: Not on file  Tobacco Use  . Smoking status: Never Smoker  . Smokeless tobacco: Never Used  Substance and Sexual Activity  . Alcohol use: Yes    Alcohol/week: 0.0 standard drinks  . Drug use: No  . Sexual activity: Yes    Partners: Male  Lifestyle  . Physical activity:    Days per week: Not on file    Minutes per session: Not on file  . Stress: Not on file  Relationships  . Social  connections:    Talks on phone: Not on file    Gets together: Not on file    Attends religious service: Not on file    Active member of club or organization: Not on file    Attends meetings of clubs or organizations: Not on file    Relationship status: Not on file  . Intimate partner violence:    Fear of current or ex partner: Not on file    Emotionally abused: Not on file    Physically abused: Not on file    Forced sexual activity: Not on file  Other Topics Concern  . Not on file  Social History Narrative  . 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  Native of [place], has lived in [place] since age 62.   No Known Allergies   Outpatient Medications Prior to Visit  Medication Sig Dispense Refill  . ipratropium (ATROVENT) 0.06 % nasal spray Place 2 sprays into both nostrils 4 (four) times daily. 15 mL 0  . azithromycin (ZITHROMAX) 250 MG tablet Take 1 tablet (250 mg total) by mouth daily. Take first 2 tablets together, then 1 every day until finished. (Patient not  taking: Reported on 04/29/2018) 6 tablet 0  . benzonatate (TESSALON) 100 MG capsule Take 1 capsule (100 mg total) by mouth every 8 (eight) hours. (Patient not taking: Reported on 04/29/2018) 21 capsule 0   No facility-administered medications prior to visit.         Objective:   Physical Exam Vitals:   05/07/18 1125  BP: 120/72  Pulse: 90  SpO2: 97%  Weight: 137 lb (62.1 kg)  Height: 6\' 4"  (1.93 m)   Gen: Pleasant, very thin man, in no distress,  normal affect  ENT: No lesions,  mouth clear,  oropharynx clear, no postnasal drip  Neck: No JVD, no stridor  Lungs: No use of accessory muscles, some coarse breath sounds superiorly  Cardiovascular: RRR, heart sounds normal, no murmur or gallops, no peripheral edema  Musculoskeletal: No deformities, no cyanosis or clubbing  Neuro: alert, non focal  Skin: Warm, no lesions or rash      Assessment & Plan:    Pneumonia of left upper lobe due to infectious organism (HCC) Significant left upper lobe and also some scattered multi lobar inflammatory change.  The left upper lobe has a thick-walled cavitary lesion with some central material either necrosis, possibly mycetoma (although looks like the former).  He was evaluated for tuberculosis had a negative QuantiFERON gold, has had 3 total negative AFB sputum.  He is supposed to do one more for the health department.  He significantly clinically improved with levofloxacin.  In fact he feels back to baseline.  Based on this I think it would be reasonable to follow him clinically and then radiographically after about 2 months to see if he has had improvement in his left upper lobe lesion.  I am concerned based on the short antibiotic course that he may be harboring some degree of infection and his cavitary lesion and he may relapse.  If so I will see him back sooner and we will reinitiate antibiotics, probably with some anaerobic coverage.  He will be reimaged in 2 months or sooner if he does relapse.  If his CT scan remains abnormal then I would favor bronchoscopy to ensure no other left upper lobe process.  Raymond Pupaobert Idabell Picking, MD, PhD 05/07/2018, 12:25 PM  Pulmonary and Critical Care [phone] or if no answer [phone]

## 2018-06-12 LAB — ACID FAST CULTURE WITH REFLEXED SENSITIVITIES (MYCOBACTERIA): Acid Fast Culture: NEGATIVE

## 2018-06-12 LAB — ACID FAST CULTURE WITH REFLEXED SENSITIVITIES

## 2018-06-29 ENCOUNTER — Ambulatory Visit (INDEPENDENT_AMBULATORY_CARE_PROVIDER_SITE_OTHER)
Admission: RE | Admit: 2018-06-29 | Discharge: 2018-06-29 | Disposition: A | Discharge status: Home / Self Care | Payer: 59 | Source: Ambulatory Visit | Attending: Emergency Medicine | Admitting: Emergency Medicine

## 2018-06-29 DIAGNOSIS — J181 Lobar pneumonia, unspecified organism: Secondary | ICD-10-CM | POA: Diagnosis not present

## 2018-06-29 DIAGNOSIS — J189 Pneumonia, unspecified organism: Secondary | ICD-10-CM

## 2018-07-07 ENCOUNTER — Encounter: Payer: Self-pay | Admitting: Emergency Medicine

## 2018-07-07 ENCOUNTER — Ambulatory Visit (INDEPENDENT_AMBULATORY_CARE_PROVIDER_SITE_OTHER): Payer: 59 | Admitting: Emergency Medicine

## 2018-07-07 VITALS — BP 120/62 | HR 95 | Ht 75.75 in | Wt 144.0 lb

## 2018-07-07 DIAGNOSIS — J181 Lobar pneumonia, unspecified organism: Secondary | ICD-10-CM

## 2018-07-07 DIAGNOSIS — J189 Pneumonia, unspecified organism: Secondary | ICD-10-CM

## 2018-07-07 DIAGNOSIS — J984 Other disorders of lung: Secondary | ICD-10-CM | POA: Diagnosis not present

## 2018-07-07 DIAGNOSIS — R9389 Abnormal findings on diagnostic imaging of other specified body structures: Secondary | ICD-10-CM

## 2018-07-07 NOTE — Assessment & Plan Note (Signed)
Presumably treated with his Levaquin.  His AFBs have been negative and he is been cleared by the health department.  He has residual cavity as described above

## 2018-07-07 NOTE — Patient Instructions (Signed)
We will plan to repeat your CT scan of the chest in 6 months to look for interval stability.  Depending on the results we will decide whether any other work-up is indicated. Please call our office if you have any new symptoms including progressive cough, change in your mucus production, fevers, weight loss.  If the symptoms develop then we may decide to further evaluate your left upper lobe abnormality sooner. Follow with Dr Delton CoombesByrum in 6 months or sooner if you have any problems

## 2018-07-07 NOTE — Assessment & Plan Note (Signed)
Very interesting CT chest with a persistent thick-walled left upper lobe cavitary lesion and internal debris suggestive of a mycetoma.  He is clinically improved.  Certainly need to consider possible opportunistic process such is invasive aspergillosis, nocardia.  All the same shows no signs of active infectious process.  I discussed possible bronchoscopy for culture data with him today.  For now we will defer, plan to repeat his CT scan of the chest to look for interval stability in 6 months.  He knows to let me know if he clinically changes and we will proceed with work-up sooner.  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  He cannot recall any overt asbestos history.  This will need to be followed as well.

## 2018-07-07 NOTE — Progress Notes (Signed)
Subjective:    Patient ID: Raymond Moon, male    DOB: 1956/08/15, 62 y.o.   MRN: [id number]  HPI 62 year old never smoker, native of [place], with little past medical history.  He developed persistent cough about 2 months ago with intermittent brownish to red mucus.  He had chills, no overt fever. He has had some concomitant weight loss of about 10 pounds over the last month. He was seen by PCP and then in the ED on 9/11. CXR and CT chest performed and reviewed by me, show a predominant cavitary left apical lesion with thick walls and some central material that could reflect necrosis versus mycetoma.  He was treated with levofloxacin at that time and his clinically improved significantly.  He no longer has cough, he feels "back to normal".  Quant gold negative, AFB on sputum negative x1 as well. He has been asked by the health department to provide repeat AFB' > tells me that he has 2 of 3 done (both negative)   He travelled to [place] in February. Felt well after returning.   ROV 07/07/18 --Raymond Moon is 62, follows up today for his abnormal CT scan of the chest with a thick-walled cavitary lesion in the left upper lobe, AFB sputum negative.  He clinically improved after treatment with antibiotics for community acquired pneumonia/pulmonary abscess.  Given the improvement we decided to follow him clinically off antibiotics in preparation for repeat CT scan of the chest.  This was done on 06/29/2018 and I have reviewed.  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  Areas of peribronchovascular nodularity have improved in the interval but are still visible.  He also has a 3 mm right lower lobe nodule that is unchanged, pleural thickening with associated calcification and scarring bilaterally.  He has some morning cough usually productive of clear mucus.  On a couple of occasions he seen some brownish mucus.  Never  blood.  Denies any dyspnea, fevers.  He is gained some weight since his original illness   Review of Systems  No past medical history on file.   No family history on file.   Social History   Socioeconomic History  . Marital status: Married    Spouse name: Not on file  . Number of children: Not on file  . Years of education: Not on file  . Highest education level: Not on file  Occupational History  . Not on file  Social Needs  . Financial resource strain: Not on file  . Food insecurity:    Worry: Not on file    Inability: Not on file  . Transportation needs:    Medical: Not on file    Non-medical: Not on file  Tobacco Use  . Smoking status: Never Smoker  . Smokeless tobacco: Never Used  Substance and Sexual Activity  . Alcohol use: Yes    Alcohol/week: 0.0 standard drinks  . Drug use: No  . Sexual activity: Yes    Partners: Male  Lifestyle  . Physical activity:    Days per week: Not on file    Minutes per session: Not on file  . Stress: Not on file  Relationships  . Social connections:    Talks on phone: Not on file    Gets together: Not on file    Attends religious service: Not on file    Active member of club or organization: Not on file  Attends meetings of clubs or organizations: Not on file    Relationship status: Not on file  . Intimate partner violence:    Fear of current or ex partner: Not on file    Emotionally abused: Not on file    Physically abused: Not on file    Forced sexual activity: Not on file  Other Topics Concern  . Not on file  Social History Narrative  . 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  Native of [place], has lived in [place] since age 78.   No Known Allergies   Outpatient Medications Prior to Visit  Medication Sig Dispense Refill  . ipratropium (ATROVENT) 0.06 % nasal spray Place 2 sprays into both nostrils 4 (four) times daily. 15 mL 0   No facility-administered  medications prior to visit.         Objective:   Physical Exam Vitals:   07/07/18 0934  BP: 120/62  Pulse: 95  SpO2: 96%  Weight: 144 lb (65.3 kg)  Height: 6' 3.75" (1.924 m)   Gen: Pleasant, very thin man, in no distress,  normal affect  ENT: No lesions,  mouth clear,  oropharynx clear, no postnasal drip  Neck: No JVD, no stridor  Lungs: No use of accessory muscles, some coarse breath sounds superiorly  Cardiovascular: RRR, heart sounds normal, no murmur or gallops, no peripheral edema  Musculoskeletal: No deformities, no cyanosis or clubbing  Neuro: alert, non focal  Skin: Warm, no lesions or rash      Assessment & Plan:  Abnormal CT of the chest Very interesting CT chest with a persistent thick-walled left upper lobe cavitary lesion and internal debris suggestive of a mycetoma.  He is clinically improved.  Certainly need to consider possible opportunistic process such is invasive aspergillosis, nocardia.  All the same shows no signs of active infectious process.  I discussed possible bronchoscopy for culture data with him today.  For now we will defer, plan to repeat his CT scan of the chest to look for interval stability in 6 months.  He knows to let me know if he clinically changes and we will proceed with work-up sooner.  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  He cannot recall any overt asbestos history.  This will need to be followed as well.  Pneumonia of left upper lobe due to infectious organism [facility]) Presumably treated with his Levaquin.  His AFBs have been negative and he is been cleared by the health department.  He has residual cavity as described above  Levy Pupa, MD, PhD 07/07/2018, 10:12 AM [facility] [phone] or if no answer [phone]

## 2018-07-29 ENCOUNTER — Telehealth: Payer: Self-pay | Admitting: Emergency Medicine

## 2018-07-29 NOTE — Telephone Encounter (Signed)
Called patient unable to reach left message to give us a call back.

## 2018-07-30 NOTE — Telephone Encounter (Signed)
Attempted to contact pt. I did not receive an answer. I have left a message for pt to return our call.  

## 2018-07-31 NOTE — Telephone Encounter (Signed)
Spoke with pt. States that at his last OV, RB wanted him to repeat his chest CT in 6 months. Pt reports that RB wanted him to have a bronch done after the CT if needed. He is wanting to know if he can go ahead and just do the bronch now.  RB - please advise. Thanks.

## 2018-08-04 NOTE — Telephone Encounter (Signed)
I agree with going ahead and setting up FOB for BAL and cx information. No fluoro, no TB risk

## 2018-08-04 NOTE — Telephone Encounter (Signed)
I have left a message with Delice Bisonara to schedule this bronch. RB would like this scheduled on 08/21/18 at 8:30am at [facility].

## 2018-08-06 NOTE — Telephone Encounter (Signed)
Spoke with Raymond Moon in respiratory. Pt has been scheduled for his bronch on 08/21/18 at 8:30am at [facility].  Spoke with pt. He is aware of his bronch appointment date, time and location.  Will route message to RB to make him aware.

## 2018-08-06 NOTE — Telephone Encounter (Signed)
Thank you :)

## 2018-08-21 ENCOUNTER — Encounter (HOSPITAL_COMMUNITY)
Admission: RE | Disposition: A | Discharge status: Home / Self Care | Payer: Self-pay | Source: Home / Self Care | Attending: Emergency Medicine

## 2018-08-21 ENCOUNTER — Ambulatory Visit (HOSPITAL_COMMUNITY): Payer: 59

## 2018-08-21 ENCOUNTER — Ambulatory Visit (HOSPITAL_COMMUNITY)
Admission: RE | Admit: 2018-08-21 | Discharge: 2018-08-21 | Disposition: A | Discharge status: Home / Self Care | Payer: 59 | Attending: Emergency Medicine | Admitting: Emergency Medicine

## 2018-08-21 ENCOUNTER — Ambulatory Visit (HOSPITAL_COMMUNITY)
Admission: RE | Admit: 2018-08-21 | Discharge: 2018-08-21 | Disposition: A | Discharge status: Home / Self Care | Payer: 59 | Source: Ambulatory Visit | Attending: Emergency Medicine | Admitting: Emergency Medicine

## 2018-08-21 DIAGNOSIS — R918 Other nonspecific abnormal finding of lung field: Secondary | ICD-10-CM | POA: Diagnosis not present

## 2018-08-21 DIAGNOSIS — R9389 Abnormal findings on diagnostic imaging of other specified body structures: Secondary | ICD-10-CM

## 2018-08-21 DIAGNOSIS — B479 Mycetoma, unspecified: Secondary | ICD-10-CM | POA: Diagnosis not present

## 2018-08-21 DIAGNOSIS — Z9889 Other specified postprocedural states: Secondary | ICD-10-CM

## 2018-08-21 DIAGNOSIS — R05 Cough: Secondary | ICD-10-CM | POA: Diagnosis not present

## 2018-08-21 DIAGNOSIS — R911 Solitary pulmonary nodule: Secondary | ICD-10-CM | POA: Diagnosis not present

## 2018-08-21 HISTORY — PX: VIDEO BRONCHOSCOPY: SHX5072

## 2018-08-21 SURGERY — BRONCHOSCOPY, WITH FLUOROSCOPY
Anesthesia: Moderate Sedation | Laterality: Bilateral

## 2018-08-21 MED ORDER — PHENYLEPHRINE HCL 0.25 % NA SOLN
NASAL | Status: DC | PRN
  Administered 2018-08-21: 2 via NASAL

## 2018-08-21 MED ORDER — LIDOCAINE HCL 1 % IJ SOLN
INTRAMUSCULAR | Status: DC | PRN
  Administered 2018-08-21: 6 mL via RESPIRATORY_TRACT

## 2018-08-21 MED ORDER — LIDOCAINE HCL URETHRAL/MUCOSAL 2 % EX GEL
CUTANEOUS | Status: DC | PRN
  Administered 2018-08-21: 1

## 2018-08-21 MED ORDER — FENTANYL CITRATE (PF) 100 MCG/2ML IJ SOLN
INTRAMUSCULAR | Status: AC
  Filled 2018-08-21: qty 4

## 2018-08-21 MED ORDER — SODIUM CHLORIDE 0.9 % IV SOLN
INTRAVENOUS | Status: DC
  Administered 2018-08-21: 08:00:00 via INTRAVENOUS

## 2018-08-21 MED ORDER — MIDAZOLAM HCL (PF) 5 MG/ML IJ SOLN
INTRAMUSCULAR | Status: AC
  Filled 2018-08-21: qty 2

## 2018-08-21 MED ORDER — PHENYLEPHRINE HCL 0.25 % NA SOLN
1.0000 | Freq: Four times a day (QID) | NASAL | Status: DC | PRN
  Filled 2018-08-21: qty 15

## 2018-08-21 MED ORDER — LIDOCAINE HCL URETHRAL/MUCOSAL 2 % EX GEL: 1.0000 "application " | Freq: Once | CUTANEOUS | Status: DC

## 2018-08-21 MED ORDER — MIDAZOLAM HCL (PF) 10 MG/2ML IJ SOLN
INTRAMUSCULAR | Status: DC | PRN
  Administered 2018-08-21: 1 mg via INTRAVENOUS
  Administered 2018-08-21: 3 mg via INTRAVENOUS
  Administered 2018-08-21 (×2): 1 mg via INTRAVENOUS

## 2018-08-21 MED ORDER — FENTANYL CITRATE (PF) 100 MCG/2ML IJ SOLN
INTRAMUSCULAR | Status: DC | PRN
  Administered 2018-08-21: 50 ug via INTRAVENOUS
  Administered 2018-08-21 (×4): 25 ug via INTRAVENOUS

## 2018-08-21 NOTE — H&P (Signed)
Raymond Moon is an 63 y.o. male.   Chief Complaint: abnormal CT chest HPI:  Raymond Moon is 4, follows up today for his abnormal CT scan of the chest with a thick-walled cavitary lesion in the left upper lobe, AFB sputum negative.  Has been treated before with some clinical improvement with antibiotics.  His most recent CT chest was 06/29/2018 showed persistent left upper lobe thick-walled cavity with some internal debris, possible mycetoma, some residual peribronchovascular nodularity.  He continues to have some mild cough, clear sputum.  No hemoptysis.  Denies any dyspnea, chest pain or any other new problems   No past medical history on file.  No past surgical history on file.  No family history on file. Social History:  reports that he has never smoked. He has never used smokeless tobacco. He reports current alcohol use. He reports that he does not use drugs.  Allergies: No Known Allergies  No medications prior to admission.    No results found for this or any previous visit (from the past 48 hour(s)). No results found.  ROS As above, otherwise negative  Blood pressure (!) 146/86, pulse 67, temperature 98 F (36.7 C), temperature source Oral, resp. rate 14, SpO2 100 %. Physical Exam  Gen: Pleasant, thin, in no distress,  normal affect  ENT: No lesions,  mouth clear,  oropharynx clear, no postnasal drip  Neck: No JVD, no stridor  Lungs: No use of accessory muscles, no crackles or wheezing on normal respiration, no wheeze on forced expiration  Cardiovascular: RRR, heart sounds normal, no murmur or gallops, no peripheral edema  Musculoskeletal: No deformities, no cyanosis or clubbing  Neuro: alert, awake, non focal  Skin: Warm, no lesions or rash   Assessment/Plan Left apical cavitary lesion, thick-walled with some internal debris.  Plan for bronchoscopy today to evaluate for infection, possible fungal or nocardia involvement, consider AFB.  Low likelihood of  malignancy given its stability over time but certainly this is still a possibility.  All questions answered, risk and benefits reviewed.  No barriers identified.   Levy Pupa, MD, PhD 08/21/2018, 8:49 AM [facility] [phone] or if no answer [phone]

## 2018-08-21 NOTE — Op Note (Signed)
[facility] Cardiopulmonary Patient Name: Raymond Moon Pocedure Date: 08/21/2018 MRN: [id number] Attending MD: Collene Gobble , MD Date of Birth: 1956/07/18 CSN: Finalized Age: 63 Admit Type: Outpatient Gender: Male Procedure:            Bronchoscopy Indications:          Abnormal CT scan of chest, Left upper lobe cavitary                        lesion Providers:            Collene Gobble, MD, Christin Fudge, Carol                        Blackstock RRT,RCP, Ashley Mariner RRT,RCP Referring MD:          Medicines:            Midazolam 6 mg IV, Fentanyl 150 mcg IV, Lidocaine 1%                        applied to cords 12 mL, Lidocaine 1% applied to the                        tracheobronchial tree 20 mL Complications:        No immediate complications Estimated Blood Loss: Estimated blood loss was minimal. Procedure:            Pre-Anesthesia Assessment:                       - A History and Physical has been performed. Patient                        meds and allergies have been reviewed. The risks and                        benefits of the procedure and the sedation options and                        risks were discussed with the patient. All questions                        were answered and informed consent was obtained.                        Patient identification and proposed procedure were                        verified prior to the procedure by the physician in the                        procedure room. Mental Status Examination: alert and                        oriented. Airway Examination: normal oropharyngeal                        airway. Respiratory Examination: clear to auscultation.                        CV Examination: normal and regular rate and rhythm. ASA  Grade Assessment: II - A patient with mild systemic                        disease. After reviewing the risks and benefits, the                        patient was deemed  in satisfactory condition to undergo                        the procedure. The anesthesia plan was to use moderate                        sedation / analgesia (conscious sedation). Immediately                        prior to administration of medications, the patient was                        re-assessed for adequacy to receive sedatives. The                        heart rate, respiratory rate, oxygen saturations, blood                        pressure, adequacy of pulmonary ventilation, and                        response to care were monitored throughout the                        procedure. The physical status of the patient was                        re-assessed after the procedure.                       After obtaining informed consent, the bronchoscope was                        passed under direct vision. Throughout the procedure,                        the patient's blood pressure, pulse, and oxygen                        saturations were monitored continuously. the BF-H190                        ([id number]) Olympus Diagnostic Bronchoscope was                        introduced through the right nostril and advanced to                        the tracheobronchial tree. The procedure was                        accomplished without difficulty. The patient tolerated                        the  procedure fairly well. The total duration of the                        procedure was 36 minutes. Scope In: 8:55:19 AM Scope Out: 9:25:05 AM Findings:      The nasopharynx/oropharynx appears normal. The larynx appears normal.       The vocal cords appear normal. The subglottic space is normal. The       trachea is of normal caliber. The carina is sharp. The tracheobronchial       tree was examined to at least the first subsegmental level. Bronchial       mucosa and anatomy are normal; there are no endobronchial lesions, and       no secretions.      Bronchoalveolar lavage was performed in the LUL  apical posterior       segments (B1 & B2) of the lung and sent for routine cytology and       bacterial, AFB and fungal analysis. 60 mL of fluid were instilled. 20 mL       were returned. The return was clear. There were no mucoid plugs in the       return fluid.      Fluoroscopy guided transbronchial brushings of a lesion were obtained in       the apical-posterior segment of the left upper lobe with a cytology       brush and sent for routine cytology, AFB analysis & culture, Nocardia       and fungal analysis. Five samples were obtained. Transbronchial brushing       technique was selected because the sampling site was not visible       endoscopically. Impression:           - Abnormal CT scan of chest                       - Left upper lobe cavitary lesion                       - The airway examination was normal.                       - Bronchoalveolar lavage was performed.                       - Transbronchial brushings were obtained. Moderate Sedation:      Moderate (conscious) sedation was personally administered by the       endoscopist. The following parameters were monitored: oxygen saturation,       heart rate, blood pressure, respiratory rate, EKG, adequacy of pulmonary       ventilation, and response to care. Total physician intraservice time was       36 minutes. Recommendation:       - Await BAL and brushing results. Procedure Code(s):    --- Professional ---                       [phone], Bronchoscopy, rigid or flexible, including                        fluoroscopic guidance, when performed; with bronchial                        alveolar lavage  [id number], Bronchoscopy, rigid or flexible, including                        fluoroscopic guidance, when performed; with brushing or                        protected brushings Diagnosis Code(s):    --- Professional ---                       R93.89, Abnormal findings on diagnostic imaging of                         other specified body structures                       R91.8, Other nonspecific abnormal finding of lung field CPT copyright 2018 American Medical Association. All rights reserved. The codes documented in this report are preliminary and upon coder review may  be revised to meet current compliance requirements. Collene Gobble, MD Collene Gobble, MD 08/21/2018 9:58:01 AM Number of Addenda: 0

## 2018-08-21 NOTE — Progress Notes (Signed)
Video Bronchoscopy with Flouroscopy Interventional bronchial washings  Interventional bronchial brushings x 2 Patient tolerated well

## 2018-08-21 NOTE — Discharge Instructions (Signed)
Flexible Bronchoscopy, Care After This sheet gives you information about how to care for yourself after your test. Your doctor may also give you more specific instructions. If you have problems or questions, contact your doctor. Follow these instructions at home: Eating and drinking  Do not eat or drink anything (not even water) for 2 hours after your test, or until your numbing medicine (local anesthetic) wears off.  When your numbness is gone and your cough and gag reflexes have come back, you may: ? Eat only soft foods. ? Slowly drink liquids.  The day after the test, go back to your normal diet. Driving  Do not drive for 24 hours if you were given a medicine to help you relax (sedative).  Do not drive or use heavy machinery while taking prescription pain medicine. General instructions   Take over-the-counter and prescription medicines only as told by your doctor.  Return to your normal activities as told. Ask what activities are safe for you.  Do not use any products that have nicotine or tobacco in them. This includes cigarettes and e-cigarettes. If you need help quitting, ask your doctor.  Keep all follow-up visits as told by your doctor. This is important. It is very important if you had a tissue sample (biopsy) taken. Get help right away if:  You have shortness of breath that gets worse.  You get light-headed.  You feel like you are going to pass out (faint).  You have chest pain.  You cough up: ? More than a little blood. ? More blood than before. Summary  Do not eat or drink anything (not even water) for 2 hours after your test, or until your numbing medicine wears off.  Do not use cigarettes. Do not use e-cigarettes.  Get help right away if you have chest pain. This information is not intended to replace advice given to you by your health care provider. Make sure you discuss any questions you have with your health care provider. Document Released: 06/02/2009  Document Revised: 08/23/2016 Document Reviewed: 08/23/2016 Elsevier Interactive Patient Education  2019 [employer].  Nothing to eat or drink until 11:30  AM today, 08-21-2018.  Any questions or concerns, please call the office at [phone].

## 2018-08-23 ENCOUNTER — Encounter (HOSPITAL_COMMUNITY): Payer: Self-pay | Admitting: Emergency Medicine

## 2018-08-23 LAB — CULTURE, BAL-QUANTITATIVE W GRAM STAIN: Culture: >=100000 — AB

## 2018-08-23 LAB — ACID FAST SMEAR (AFB, MYCOBACTERIA): Acid Fast Smear: NEGATIVE

## 2018-08-23 LAB — CULTURE, BAL-QUANTITATIVE

## 2018-08-25 LAB — FUNGAL STAIN REFLEX

## 2018-08-27 ENCOUNTER — Encounter: Payer: Self-pay | Admitting: Emergency Medicine

## 2018-08-27 ENCOUNTER — Ambulatory Visit (INDEPENDENT_AMBULATORY_CARE_PROVIDER_SITE_OTHER): Payer: 59 | Admitting: Emergency Medicine

## 2018-08-27 DIAGNOSIS — R9389 Abnormal findings on diagnostic imaging of other specified body structures: Secondary | ICD-10-CM

## 2018-08-27 LAB — ACID FAST SMEAR (AFB, MYCOBACTERIA): Acid Fast Smear: NEGATIVE

## 2018-08-27 LAB — FUNGUS STAIN

## 2018-08-27 NOTE — Progress Notes (Signed)
Subjective:    Patient ID: Raymond Moon, male    DOB: March 12, 1956, 63 y.o.   MRN: [id number]  HPI 63 year old never smoker, native of [place], with little past medical history.  He developed persistent cough about 2 months ago with intermittent brownish to red mucus.  He had chills, no overt fever. He has had some concomitant weight loss of about 10 pounds over the last month. He was seen by PCP and then in the ED on 9/11. CXR and CT chest performed and reviewed by me, show a predominant cavitary left apical lesion with thick walls and some central material that could reflect necrosis versus mycetoma.  He was treated with levofloxacin at that time and his clinically improved significantly.  He no longer has cough, he feels "back to normal".  Quant gold negative, AFB on sputum negative x1 as well. He has been asked by the health department to provide repeat AFB' > tells me that he has 2 of 3 done (both negative)   He travelled to [place] in February. Felt well after returning.   ROV 07/07/18 --Mr. Cato is 62, follows up today for his abnormal CT scan of the chest with a thick-walled cavitary lesion in the left upper lobe, AFB sputum negative.  He clinically improved after treatment with antibiotics for community acquired pneumonia/pulmonary abscess.  Given the improvement we decided to follow him clinically off antibiotics in preparation for repeat CT scan of the chest.  This was done on 06/29/2018 and I have reviewed.  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  Areas of peribronchovascular nodularity have improved in the interval but are still visible.  He also has a 3 mm right lower lobe nodule that is unchanged, pleural thickening with associated calcification and scarring bilaterally.  He has some morning cough usually productive of clear mucus.  On a couple of occasions he seen some brownish mucus.  Never  blood.  Denies any dyspnea, fevers.  He is gained some weight since his original illness  ROV 08/27/18 --this is a follow-up visit for 63 year old gentleman who has a thick-walled cavitary lesion in his left upper lobe, negative sputum AFB.  He was treated for possible lung abscess.  Follow-up imaging showed that the thick-walled cavity persists with some internal debris consistent with a mycetoma.  He underwent bronchoscopy on 08/21/2018, brushings showed acute and granulomatous inflammation, transbronchial brushings fungal smear were ??  positive for Aspergillus - difficult to interpret the results, negative smear for AFB   Review of Systems  No past medical history on file.   No family history on file.   Social History   Socioeconomic History  . Marital status: Married    Spouse name: Not on file  . Number of children: Not on file  . Years of education: Not on file  . Highest education level: Not on file  Occupational History  . Not on file  Social Needs  . Financial resource strain: Not on file  . Food insecurity:    Worry: Not on file    Inability: Not on file  . Transportation needs:    Medical: Not on file    Non-medical: Not on file  Tobacco Use  . Smoking status: Never Smoker  . Smokeless tobacco: Never Used  Substance and Sexual Activity  . Alcohol use: Yes    Alcohol/week: 0.0 standard drinks  . Drug use: No  . Sexual activity: Yes  Partners: Male  Lifestyle  . Physical activity:    Days per week: Not on file    Minutes per session: Not on file  . Stress: Not on file  Relationships  . Social connections:    Talks on phone: Not on file    Gets together: Not on file    Attends religious service: Not on file    Active member of club or organization: Not on file    Attends meetings of clubs or organizations: Not on file    Relationship status: Not on file  . Intimate partner violence:    Fear of current or ex partner: Not on file    Emotionally abused: Not on  file    Physically abused: Not on file    Forced sexual activity: Not on file  Other Topics Concern  . Not on file  Social History Narrative  . Not on file  [place] in [place], [place] in [place], Art gallery managerengineer.  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  Native of [place], has lived in [place] since age 63.   No Known Allergies   No outpatient medications prior to visit.   No facility-administered medications prior to visit.         Objective:   Physical Exam Vitals:   08/27/18 1626  BP: 118/72  Pulse: 91  SpO2: 97%  Weight: 148 lb (67.1 kg)  Height: 6' 2.5" (1.892 m)   Gen: Pleasant, very thin man, in no distress,  normal affect  ENT: No lesions,  mouth clear,  oropharynx clear, no postnasal drip  Neck: No JVD, no stridor  Lungs: No use of accessory muscles, some coarse breath sounds superiorly  Cardiovascular: RRR, heart sounds normal, no murmur or gallops, no peripheral edema  Musculoskeletal: No deformities, no cyanosis or clubbing  Neuro: alert, non focal  Skin: Warm, no lesions or rash      Assessment & Plan:  No problem-specific Assessment & Plan notes found for this encounter.  Levy Pupaobert Mata Rowen, MD, PhD 08/27/2018, 4:37 PM [facility] [phone] or if no answer [phone]

## 2018-08-27 NOTE — Assessment & Plan Note (Signed)
LUL cavitary lesion w mycetoma. S/p FOB last week. I performed brushings for cytology and smears. There was acute and granulomatous inflammation, fungal brushing ?? Shows evidence for aspergillus. Difficult for me to interpret the result as posted in Epic. I will need to call the micro lab to clarify. He may require antifungals if this is correct. I will contact him when I have the information

## 2018-08-27 NOTE — Patient Instructions (Signed)
I am glad that you are doing well after your bronchoscopy. All of your culture information is negative so far.  Some of your results are still pending.  We will notify you when the final fungal cultures, mycobacterial cultures have resulted. Follow-up in 1 month to review all of this information and decide whether treatment of any kind is necessary.

## 2018-09-24 ENCOUNTER — Encounter: Payer: Self-pay | Admitting: Emergency Medicine

## 2018-09-24 ENCOUNTER — Ambulatory Visit (INDEPENDENT_AMBULATORY_CARE_PROVIDER_SITE_OTHER): Payer: 59 | Admitting: Emergency Medicine

## 2018-09-24 DIAGNOSIS — R9389 Abnormal findings on diagnostic imaging of other specified body structures: Secondary | ICD-10-CM

## 2018-09-24 NOTE — Patient Instructions (Signed)
Please get your CT scan of the chest in April as already planned. We will follow your bronchoscopy culture information until its completed.  We will call you if there are any new findings Follow-up in April after your CT scan to review the results together.

## 2018-09-24 NOTE — Progress Notes (Signed)
Subjective:    Patient ID: Sora Urgiles, male    DOB: 02/15/1956, 63 y.o.   MRN: [id number]  HPI 63 year old never smoker, native of [place], with little past medical history.  He developed persistent cough about 2 months ago with intermittent brownish to red mucus.  He had chills, no overt fever. He has had some concomitant weight loss of about 10 pounds over the last month. He was seen by PCP and then in the ED on 9/11. CXR and CT chest performed and reviewed by me, show a predominant cavitary left apical lesion with thick walls and some central material that could reflect necrosis versus mycetoma.  He was treated with levofloxacin at that time and his clinically improved significantly.  He no longer has cough, he feels "back to normal".  Quant gold negative, AFB on sputum negative x1 as well. He has been asked by the health department to provide repeat AFB' > tells me that he has 2 of 3 done (both negative)   He travelled to [place] in February. Felt well after returning.   ROV 07/07/18 --Mr. Stahly is 63, follows up today for his abnormal CT scan of the chest with a thick-walled cavitary lesion in the left upper lobe, AFB sputum negative.  He clinically improved after treatment with antibiotics for community acquired pneumonia/pulmonary abscess.  Given the improvement we decided to follow him clinically off antibiotics in preparation for repeat CT scan of the chest.  This was done on 06/29/2018 and I have reviewed.  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  Areas of peribronchovascular nodularity have improved in the interval but are still visible.  He also has a 3 mm right lower lobe nodule that is unchanged, pleural thickening with associated calcification and scarring bilaterally.  He has some morning cough usually productive of clear mucus.  On a couple of occasions he seen some brownish mucus.  Never  blood.  Denies any dyspnea, fevers.  He is gained some weight since his original illness  ROV 08/27/18 --this is a follow-up visit for 63 year old gentleman who has a thick-walled cavitary lesion in his left upper lobe, negative sputum AFB.  He was treated for possible lung abscess.  Follow-up imaging showed that the thick-walled cavity persists with some internal debris consistent with a mycetoma.  He underwent bronchoscopy on 08/21/2018, brushings showed acute and granulomatous inflammation, transbronchial brushings fungal smear were ??  positive for Aspergillus - difficult to interpret the results, negative smear for AFB  ROV 09/24/18 --Mr. Sholl follows up today for his history of an abnormal CT scan of the chest.  He has a thick-walled left upper lobe cavity that is been treated as possible lung abscess.  Sputum AFB was negative.  He had acute and granulomatous inflammation on bronchoscopy and brushings, bacterial culture negative, AFB smear negative (culture pending).  He had a mold isolated that has not yet been speciated. He reports that he is having some cough, usually clear, sometimes sees brown mucous in the am. No blood.    Review of Systems      Objective:   Physical Exam Vitals:   09/24/18 1611  BP: 118/70  Pulse: 92  SpO2: 98%  Weight: 142 lb 12.8 oz (64.8 kg)  Height: 6' 2.5" (1.892 m)   Gen: Pleasant, very thin man, in no distress,  normal affect  ENT: No lesions,  mouth clear,  oropharynx clear, no postnasal drip  Neck: No JVD, no stridor  Lungs: No use of accessory muscles, some coarse breath sounds superiorly  Cardiovascular: RRR, heart sounds normal, no murmur or gallops, no peripheral edema  Musculoskeletal: No deformities, no cyanosis or clubbing  Neuro: alert, non focal  Skin: Warm, no lesions or rash      Assessment & Plan:  Abnormal CT of the chest Left apical cavity, presumed treated lung abscess with a thick wall and internal debris versus mycetoma.   His bronchoscopy showed acute and granulomatous inflammation, unclear whether there is active infection present.  He had one fungal culture that looks like it is going to grow positive but speciation is not yet available.  His AFB culture was still pending as well.  He is clinically improved I think we can follow him until the culture data is available.  We are planning to repeat a CT scan of his chest in April to look for interval change.  If he changes clinically will repeat the CT sooner.  Levy Pupaobert Ashaki Frosch, MD, PhD 09/24/2018, 5:34 PM [facility] [phone] or if no answer [phone]

## 2018-09-24 NOTE — Assessment & Plan Note (Signed)
Left apical cavity, presumed treated lung abscess with a thick wall and internal debris versus mycetoma.  His bronchoscopy showed acute and granulomatous inflammation, unclear whether there is active infection present.  He had one fungal culture that looks like it is going to grow positive but speciation is not yet available.  His AFB culture was still pending as well.  He is clinically improved I think we can follow him until the culture data is available.  We are planning to repeat a CT scan of his chest in April to look for interval change.  If he changes clinically will repeat the CT sooner.

## 2018-10-04 LAB — ACID FAST CULTURE WITH REFLEXED SENSITIVITIES (MYCOBACTERIA): Acid Fast Culture: NEGATIVE

## 2018-10-13 LAB — FUNGAL ORGANISM REFLEX

## 2018-10-13 LAB — FUNGUS CULTURE WITH STAIN

## 2018-10-13 LAB — FUNGUS CULTURE RESULT

## 2018-10-15 ENCOUNTER — Telehealth: Payer: Self-pay | Admitting: Emergency Medicine

## 2018-10-15 NOTE — Telephone Encounter (Signed)
[facility] 10/29/18 at 1:00pm pt aware of the appt

## 2018-10-15 NOTE — Telephone Encounter (Signed)
Returned call to patient, he states he got a call from someone here in this office stating he needs to schedule his Chest CT sooner than April. Per RB patient needs Chest CT sooner than may. Informed patient I would get this message to [facility] so that they can help him get this scheduled.   [place], ladies please schedule this patients Chest CT at first available per RB.

## 2018-10-15 NOTE — Telephone Encounter (Signed)
Patient is available for CT scan March 6-13.  Patient phone number is [phone].

## 2018-10-29 ENCOUNTER — Other Ambulatory Visit: Payer: Self-pay

## 2018-10-29 ENCOUNTER — Ambulatory Visit (INDEPENDENT_AMBULATORY_CARE_PROVIDER_SITE_OTHER)
Admission: RE | Admit: 2018-10-29 | Discharge: 2018-10-29 | Disposition: A | Discharge status: Home / Self Care | Payer: 59 | Source: Ambulatory Visit | Attending: Emergency Medicine | Admitting: Emergency Medicine

## 2018-10-29 DIAGNOSIS — J984 Other disorders of lung: Secondary | ICD-10-CM

## 2018-11-18 ENCOUNTER — Other Ambulatory Visit: Payer: Self-pay

## 2018-11-18 ENCOUNTER — Ambulatory Visit (INDEPENDENT_AMBULATORY_CARE_PROVIDER_SITE_OTHER): Payer: 59 | Admitting: Primary Care

## 2018-11-18 ENCOUNTER — Encounter: Payer: Self-pay | Admitting: Primary Care

## 2018-11-18 DIAGNOSIS — J984 Other disorders of lung: Secondary | ICD-10-CM

## 2018-11-18 DIAGNOSIS — B449 Aspergillosis, unspecified: Secondary | ICD-10-CM | POA: Diagnosis not present

## 2018-11-18 NOTE — Patient Instructions (Signed)
Fungal culture grew out a small amount of aspergillus   Super-D showed stable appearance of cavitary lesion  No active treatment needed at this time, will discuss further with Dr. Delton Coombes and if he advises additional treatment I will let you know  Safe healthy and safe during Covid-19 outbreak, I have added some information for your reference   Follow up in 3-4 months with Dr. Delton Coombes   Coronavirus (COVID-19) Are you at risk?  Are you at risk for the Coronavirus (COVID-19)?  To be considered HIGH RISK for Coronavirus (COVID-19), you have to meet the following criteria:  . Traveled to [place], [place], [place], [place] or [place]; or in the [place] to [place], [place], [place], or [place]; and have fever, cough, and shortness of breath within the last 2 weeks of travel OR . Been in close contact with a person diagnosed with COVID-19 within the last 2 weeks and have fever, cough, and shortness of breath . IF YOU DO NOT MEET THESE CRITERIA, YOU ARE CONSIDERED LOW RISK FOR COVID-19.  What to do if you are HIGH RISK for COVID-19?  Marland Kitchen If you are having a medical emergency, call 911. . Seek medical care right away. Before you go to a doctor's office, urgent care or emergency department, call ahead and tell them about your recent travel, contact with someone diagnosed with COVID-19, and your symptoms. You should receive instructions from your physician's office regarding next steps of care.  . When you arrive at healthcare provider, tell the healthcare staff immediately you have returned from visiting [place], [place], [place], [place] or [place]; or traveled in the [place] to [place], [place], [place], or [place]; in the last two weeks or you have been in close contact with a person diagnosed with COVID-19 in the last 2 weeks.   . Tell the health care staff about your symptoms: fever, cough and shortness of breath. . After you have been seen by a medical provider, you will be  either: o Tested for (COVID-19) and discharged home on quarantine except to seek medical care if symptoms worsen, and asked to  - Stay home and avoid contact with others until you get your results (4-5 days)  - Avoid travel on public transportation if possible (such as bus, train, or airplane) or o Sent to the Emergency Department by EMS for evaluation, COVID-19 testing, and possible admission depending on your condition and test results.  What to do if you are LOW RISK for COVID-19?  Reduce your risk of any infection by using the same precautions used for avoiding the common cold or flu:  Marland Kitchen Wash your hands often with soap and warm water for at least 20 seconds.  If soap and water are not readily available, use an alcohol-based hand sanitizer with at least 60% alcohol.  . If coughing or sneezing, cover your mouth and nose by coughing or sneezing into the elbow areas of your shirt or coat, into a tissue or into your sleeve (not your hands). . Avoid shaking hands with others and consider head nods or verbal greetings only. . Avoid touching your eyes, nose, or mouth with unwashed hands.  . Avoid close contact with people who are sick. . Avoid places or events with large numbers of people in one location, like concerts or sporting events. . Carefully consider travel plans you have or are making. . If you are planning any travel outside or inside the [place], visit the CDC's  Travelers' Health webpage for the latest health notices. . If you have some symptoms but not all symptoms, continue to monitor at home and seek medical attention if your symptoms worsen. . If you are having a medical emergency, call 911.   ADDITIONAL HEALTHCARE OPTIONS FOR PATIENTS  [facility] / e-Visit: [email or web address]         [facility]: [phone]  [facility]: [phone]                   [facility]: [phone]

## 2018-11-18 NOTE — Progress Notes (Signed)
Virtual Visit via Video Note  I connected with Raymond Moon on 11/18/18 at 10:00 AM EDT by a video enabled telemedicine application and verified that I am speaking with the correct person using two identifiers.   I discussed the limitations of evaluation and management by telemedicine and the availability of in person appointments. The patient expressed understanding and agreed to proceed.  Patient at home, agreeing to E-[employer]. Myself and Raymond Moon present on phone call. My nurse Raymond Moon to set up follow-up and mail AVS.  History of Present Illness: 63 year old male, never smoked. PMH significant for bullous emphysema, LUL pneumonia, abnormal CT chest, cough, weight loss, prediabetes. Patient of Dr. Delton Coombes, seen in office 09/24/18.   Bacterial and AFB cultures negative as of 10/13/18. Fugal culture grew out 1-2 colonies of Aspergillus. 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 No significant change in peribronchovascular nodularity in the posterior left upper lobe, suspicious for atypical infectious process.   11/18/2018 Patient called today for follow up visit to discuss recent Super-D CT results. Advised patient of results which showed stable appearance of cavitary lesion. He is doing fine. No acute complaints. Denies shortness of breath, cough or fevers.   Observations/Objective:  - No shortness of breath, wheezing or cough   Assessment and Plan:  Cavitary lesion of lung - Super-D CT chest image showed stable appearance of cavitary lesion and no significant change in nodularity in posterior left upper lobe  Sputum culture positive for Aspergullius - Denies cough or fever  - Fungal culture grew small amount of aspergillus - No active treatment needed at this time, will discuss further with Dr. Delton Coombes  Follow Up  Instructions:  3-4 months with Dr. Delton Coombes    I discussed the assessment and treatment plan with the patient. The patient was provided an opportunity to ask questions and all were answered. The patient agreed with the plan and demonstrated an understanding of the instructions.   The patient was advised to call back or seek an in-person evaluation if the symptoms worsen or if the condition fails to improve as anticipated.  I provided 20 minutes of non-face-to-face time during this encounter.   Glenford Bayley, NP

## 2018-11-19 ENCOUNTER — Other Ambulatory Visit: Payer: Self-pay

## 2018-11-19 DIAGNOSIS — J984 Other disorders of lung: Secondary | ICD-10-CM

## 2018-12-14 ENCOUNTER — Ambulatory Visit: Payer: 59 | Admitting: Emergency Medicine

## 2019-01-28 ENCOUNTER — Telehealth: Payer: Self-pay | Admitting: Emergency Medicine

## 2019-01-28 NOTE — Telephone Encounter (Signed)
He can call [email or web address]  [phone] and she will reschedule it she probably called him Raymond Moon

## 2019-01-28 NOTE — Telephone Encounter (Signed)
Patient walked into the clinic this afternoon. He stated that he received a call yesterday for his CT scan appointment in August. He is scheduled for 03/22/19 at 845.   He wants to know if he could get this rescheduled for 03/24/19, anytime in the AM.   Advised him I would reach out to the [facility] to see if they could help, he verbalized understanding.   PCCs, please advise. Thanks!

## 2019-01-28 NOTE — Telephone Encounter (Signed)
Spoke with pt, I gave him the number to central scheduling so he could reschedule his appt. Pt understood and nothing further is needed.

## 2019-03-22 ENCOUNTER — Inpatient Hospital Stay: Admission: RE | Admit: 2019-03-22 | Payer: 59 | Source: Ambulatory Visit

## 2019-03-24 ENCOUNTER — Inpatient Hospital Stay: Admission: RE | Admit: 2019-03-24 | Payer: 59 | Source: Ambulatory Visit

## 2019-06-07 ENCOUNTER — Telehealth: Payer: Self-pay | Admitting: Emergency Medicine

## 2019-06-07 ENCOUNTER — Ambulatory Visit (INDEPENDENT_AMBULATORY_CARE_PROVIDER_SITE_OTHER): Payer: 59 | Admitting: Primary Care

## 2019-06-07 ENCOUNTER — Encounter: Payer: 59 | Admitting: Primary Care

## 2019-06-07 ENCOUNTER — Encounter: Payer: Self-pay | Admitting: Primary Care

## 2019-06-07 DIAGNOSIS — J209 Acute bronchitis, unspecified: Secondary | ICD-10-CM

## 2019-06-07 DIAGNOSIS — J440 Chronic obstructive pulmonary disease with acute lower respiratory infection: Secondary | ICD-10-CM

## 2019-06-07 MED ORDER — AZITHROMYCIN 250 MG PO TABS: ORAL_TABLET | ORAL | 0 refills | Status: DC

## 2019-06-07 NOTE — Progress Notes (Signed)
 Virtual Visit via Telephone Note  I connected with Raymond Moon on 06/07/19 at  1:30 PM EDT by telephone and verified that I am speaking with the correct person using two identifiers.  Location: Patient: Home Provider: Office    I discussed the limitations, risks, security and privacy concerns of performing an evaluation and management service by telephone and the availability of in person appointments. I also discussed with the patient that there may be a patient responsible charge related to this service. The patient expressed understanding and agreed to proceed.   History of Present Illness: 63 year old male, never smoked. PMH significant for bullous emphysema, LUL pneumonia, abnormal CT chest, cough, weight loss, prediabetes. Patient of Dr. Shelah, followed for thick-walled cavity lesion in the left upper lobe. Presumed lung abscesses treated with Levaquin  and clinically improved.   Bacterial and AFB cultures negative as of 10/13/18. Fugal culture grew out 1-2 colonies of Aspergillus. 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 No significant change in peribronchovascular nodularity in the posterior left upper lobe, suspicious for atypical infectious process.   06/07/2019 Patient contacted today for virtual video visit. Reports experiencing episode of hemoptysis yesterday morning, this lasted 1-2 hours and he coughed up brownish-yellow mucus 6-7 times. No frank red blood. Less this morning. He usually does have morning congestion. He is not on any medication including blood thinners. He took aspirin a few weeks ago for a headache. Weight stable since last year. Patient has repeat Super-D CT chest scheduled for October 28th. Denies shortness of breath, wheezing, chest tightness, chest/pleuretic pain or nasal congestion.    Observations/Objective:  - No s Assessment and Plan:   Follow Up Instructions:    I discussed the assessment and treatment plan with the patient. The patient was provided an opportunity to ask questions and all were answered. The patient agreed with the plan and demonstrated an understanding of the instructions.   The patient was advised to call back or seek an in-person evaluation if the symptoms worsen or if the condition fails to improve as anticipated.    Raymond LELON Ferrari, NP

## 2019-06-07 NOTE — Progress Notes (Signed)
Virtual Visit via Telephone Note  I connected with Raymond Moon on 06/07/19 at  1:30 PM EDT by telephone and verified that I am speaking with the correct person using two identifiers.  Location: Patient: Home Provider: Office   I discussed the limitations, risks, security and privacy concerns of performing an evaluation and management service by telephone and the availability of in person appointments. I also discussed with the patient that there may be a patient responsible charge related to this service. The patient expressed understanding and agreed to proceed.  History of Present Illness: 63 year old male, never smoked. PMH significant for bullous emphysema, LUL pneumonia, abnormal CT chest, cough, weight loss, prediabetes. Patient of Dr. Lamonte Sakai, followed for thick-walled cavity lesion in the left upper lobe. Presumed lung abscesses treated with Levaquin and clinically improved.   Bacterial and AFB cultures negative as of 10/13/18. Fugal culture grew out 1-2 colonies of Aspergillus. 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 No significant change in peribronchovascular nodularity in the posterior left upper lobe, suspicious for atypical infectious process.   06/07/2019 Patient contacted today for virtual video visit. Reports experiencing episode of hemoptysis yesterday morning, this lasted 1-2 hours and he coughed up brownish-yellow mucus 6-7 times. No frank red blood. Less this morning. He usually does have morning congestion. He is not on any medication including blood thinners. He took aspirin a few weeks ago for a headache. Weight stable since last year. Patient has repeat Super-D CT chest scheduled for October 28th. Denies shortness of breath, wheezing, chest tightness, chest/pleuretic pain or nasal congestion.     Observations/Objective:  - No shortness of breath, wheezing or cough  Assessment and Plan:  Acute Bronchitis with COPD/emphysema  - Increased cough with scant hemoptysis - RX zpack as directed - Recommend Delsym twice daily for cough suppression - If hemoptysis significantly worsens please let office know or go to ED  Cavitary lesion, left upper lobe - Scheduled for repeat Super-D CT chest on 10/28  Follow Up Instructions:   - Follow-up after CT chest results  I discussed the assessment and treatment plan with the patient. The patient was provided an opportunity to ask questions and all were answered. The patient agreed with the plan and demonstrated an understanding of the instructions.   The patient was advised to call back or seek an in-person evaluation if the symptoms worsen or if the condition fails to improve as anticipated.  I provided 22 minutes of non-face-to-face time during this encounter.   Martyn Ehrich, NP

## 2019-06-07 NOTE — Patient Instructions (Addendum)
Pleasure speaking with you Mr. Mucci, treated you for mild bronchitis with Zpack Monitor hemoptysis (blood in sputum), if worsens please let office know or seek emergency care   Rx: Z-pack as directed  Follow-up: You have Super-D CT Chest scheduled for October 28th, will follow-up after results

## 2019-06-07 NOTE — Progress Notes (Deleted)
Virtual Visit via Video Note  I connected with Raymond Moon on 06/07/19 at  1:30 PM EDT by a video enabled telemedicine application and verified that I am speaking with the correct person using two identifiers.  Location: Patient: *** Provider: ***   I discussed the limitations of evaluation and management by telemedicine and the availability of in person appointments. The patient expressed understanding and agreed to proceed.  History of Present Illness: 63 year old male, never smoked. PMH significant for bullous emphysema, LUL pneumonia, abnormal CT chest, cough, weight loss, prediabetes. Patient of Dr. Lamonte Sakai, followed for thick-walled cavity lesion in the left upper lobe. Presumed lung abscesses treated with Levaquin and clinically improved.   Bacterial and AFB cultures negative as of 10/13/18. Fugal culture grew out 1-2 colonies of Aspergillus. 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 No significant change in peribronchovascular nodularity in the posterior left upper lobe, suspicious for atypical infectious process.    06/07/2019 Patient contacted today for virtual video visit.   Reports one episode of hemoptysis. He is not on any medication including blood thinners. Patient has repeat Super-D CT chest scheduled for October 28th.     Observations/Objective:   Assessment and Plan:   Follow Up Instructions:    I discussed the assessment and treatment plan with the patient. The patient was provided an opportunity to ask questions and all were answered. The patient agreed with the plan and demonstrated an understanding of the instructions.   The patient was advised to call back or seek an in-person evaluation if the symptoms worsen or if the condition fails to improve as anticipated.  I provided *** minutes of  non-face-to-face time during this encounter.   Martyn Ehrich, NP

## 2019-06-07 NOTE — Telephone Encounter (Signed)
Primary Pulmonologist: Byrum Last office visit and with whom: Beth NP 4.1.2020 for video visit What do we see them for (pulmonary problems): cavitary lung lesion Last OV assessment/plan:  Assessment and Plan: Cavitary lesion of lung - Super-D CT chest image showed stable appearance of cavitary lesion and no significant change in nodularity in posterior left upper lobe   Sputum culture positive for Aspergullius - Denies cough or fever  - Fungal culture grew small amount of aspergillus - No active treatment needed at this time, will discuss further with Dr. Lamonte Sakai   Was appointment offered to patient (explain)?  Yes - scheduled for video visit with [facility] NP today 06/07/19 @ 1330   Reason for call: called spoke with patient who c/o BRB blended with tan mucus x2 episodes - yesterday morning and this morning.  Patient has never had this symptom before.  Denies any increased SHOB, wheezing, tightness in chest, f/c/s, head congestion or PND.  Advised patient that appt would be best to assess symptom >> MyChart video visit scheduled with Beth NP for 1330 this afternoon.  Patient has HRCT scheduled for 10.28.2020 as ordered by [facility] NP at last visit.

## 2019-06-08 ENCOUNTER — Telehealth: Payer: Self-pay | Admitting: Primary Care

## 2019-06-08 NOTE — Telephone Encounter (Signed)
Called and spoke with [facility]. Diagnosis code J44.0 and J20.9 given for Zpack taper sent to pharmacy by Eustaquio Maize, NP. Patient called and made aware diagnosis code was called to pharmacy. Nothing further at this time.

## 2019-06-16 ENCOUNTER — Inpatient Hospital Stay: Admission: RE | Admit: 2019-06-16 | Payer: 59 | Source: Ambulatory Visit

## 2019-06-17 ENCOUNTER — Other Ambulatory Visit: Payer: 59

## 2019-09-14 IMAGING — CT CT CHEST W/ CM
2 of 3 series · 15 of 36 positions shown, 18 images · IV contrast (APPLIED)
Comparison: Chest radiograph 04/29/2018

CLINICAL DATA: Nectar cough for 6 weeks, weight loss, question TB,
history of bullous emphysema

EXAM:
CT CHEST WITH CONTRAST
TECHNIQUE: Multidetector CT imaging of the chest was performed during
intravenous contrast administration. Sagittal and coronal MPR images
reconstructed from axial data set.
CONTRAST:  75mL OMNIPAQUE IOHEXOL 300 MG/ML  SOLN IV.

[Series 3: thorax 2.0 i31f 2 · axial · 0.72mm/px · z∈[+86,+382]mm · 12 of 174 slices shown, 15 images]
[im 13/174  mediastinal]
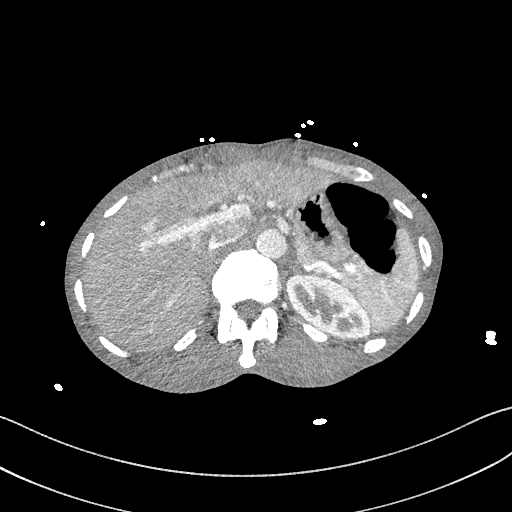
[im 13/174  lung]
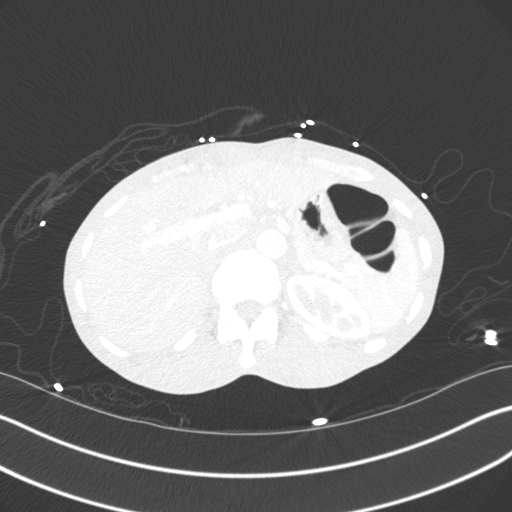
[im 26/174  lung]
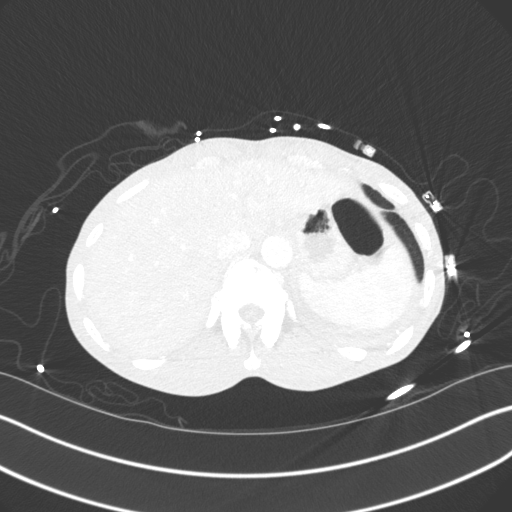
[im 39/174  lung]
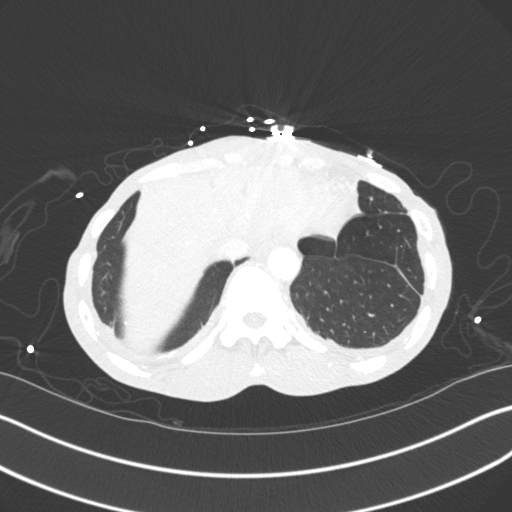
[im 52/174  lung]
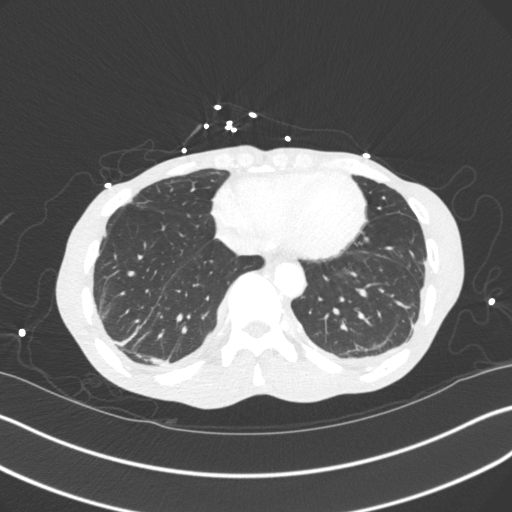
[im 65/174  mediastinal]
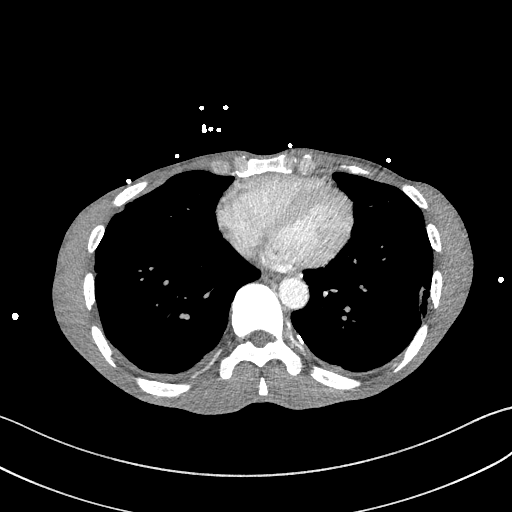
[im 65/174  lung]
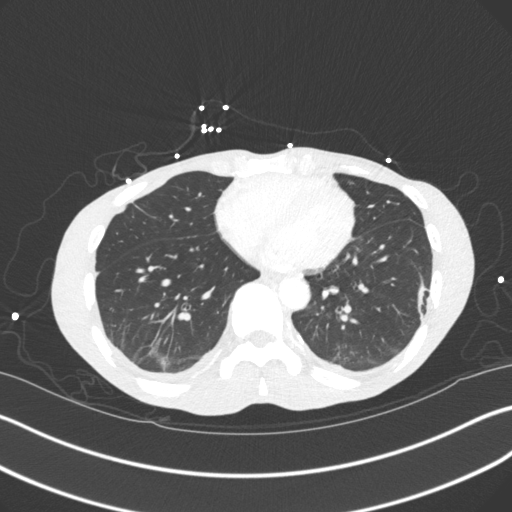
[im 77/174  lung]
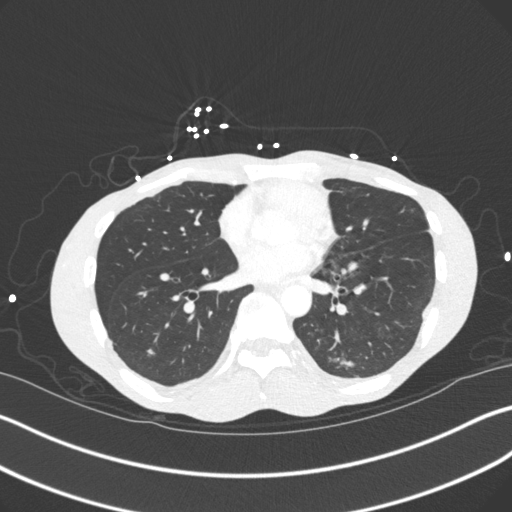
[im 97/174  lung]
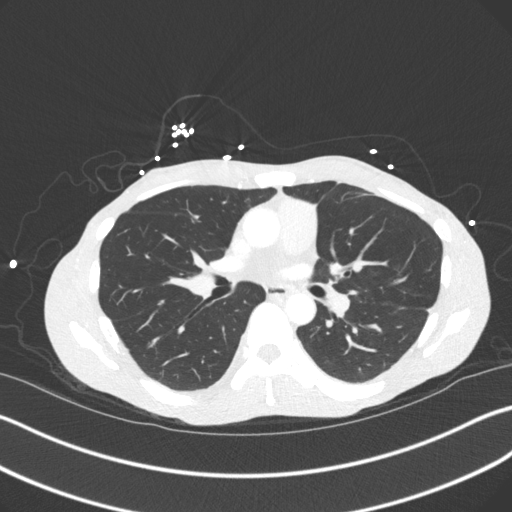
[im 109/174  lung]
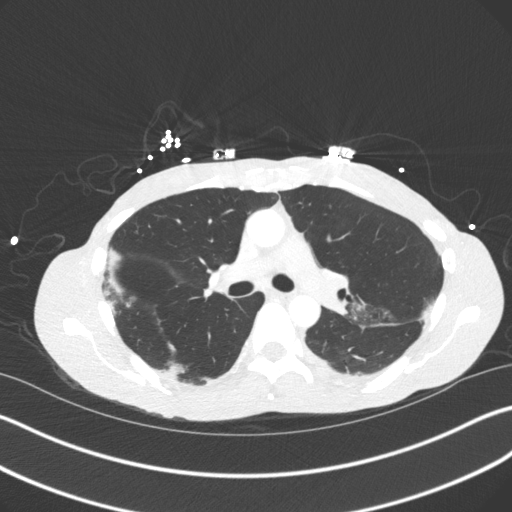
[im 122/174  mediastinal]
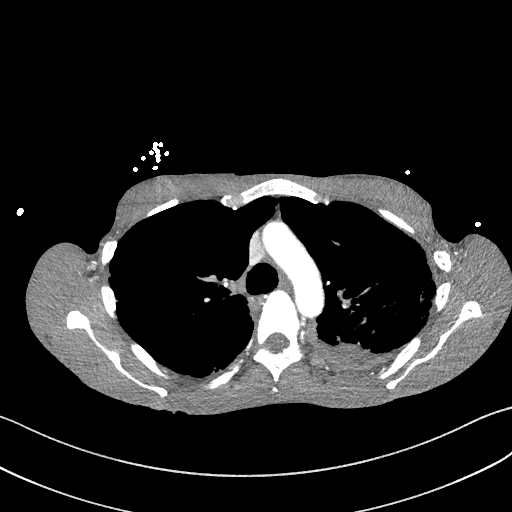
[im 122/174  lung]
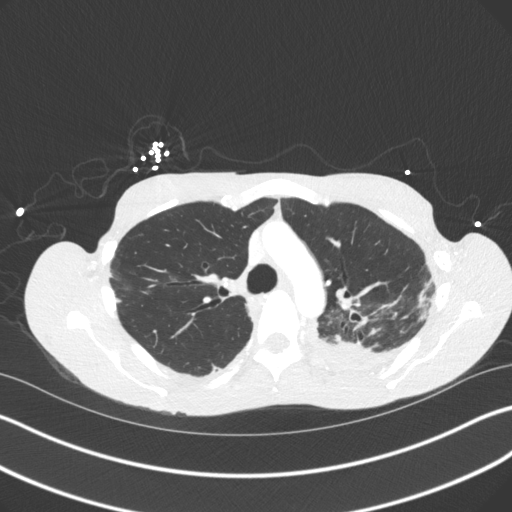
[im 135/174  lung]
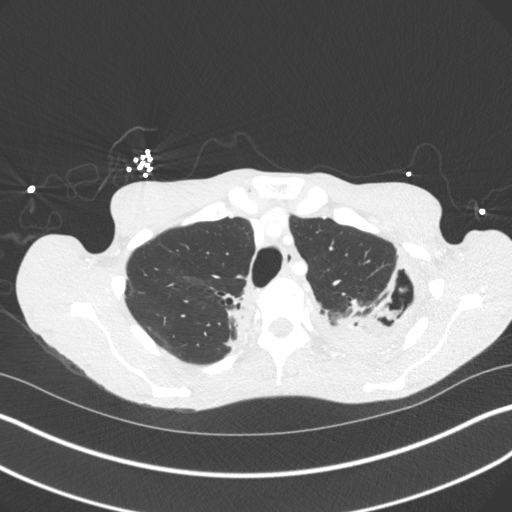
[im 148/174  lung]
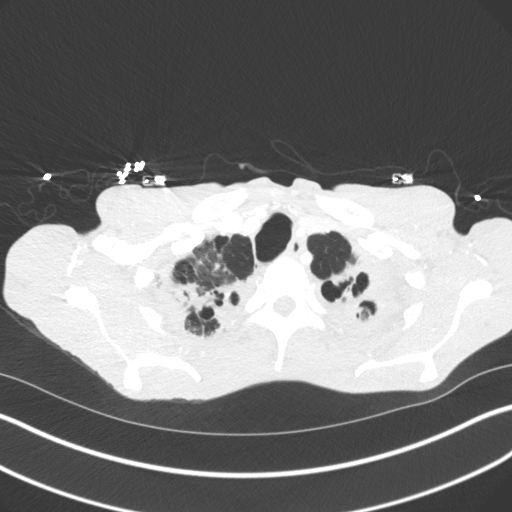
[im 161/174  lung]
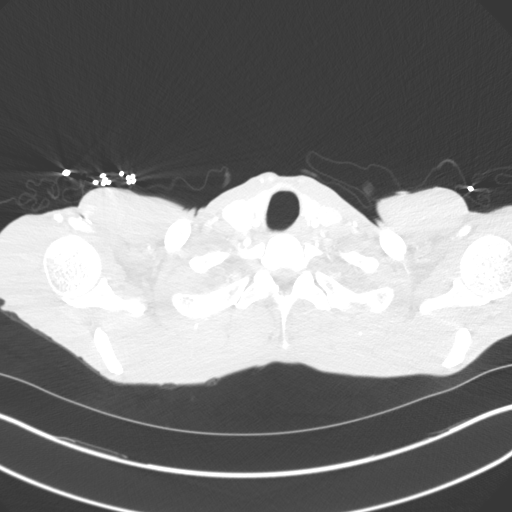

[Series 5: coronal · coronal · 0.68mm/px · 3 of 117 slices shown]
[im 24/117  lung]
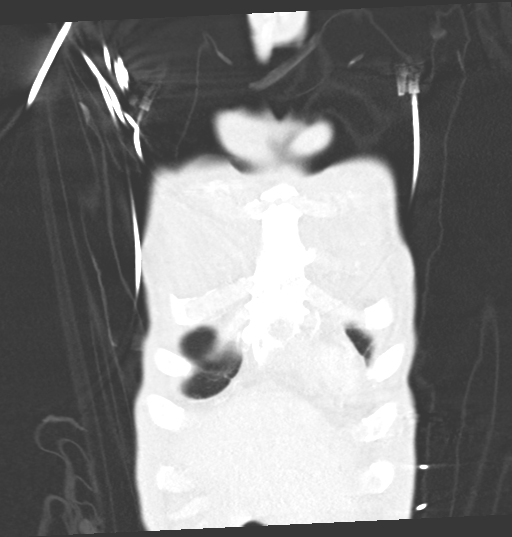
[im 47/117  lung]
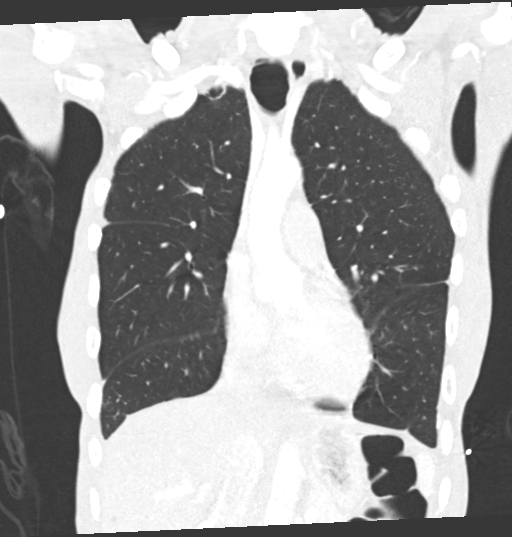
[im 70/117  lung]
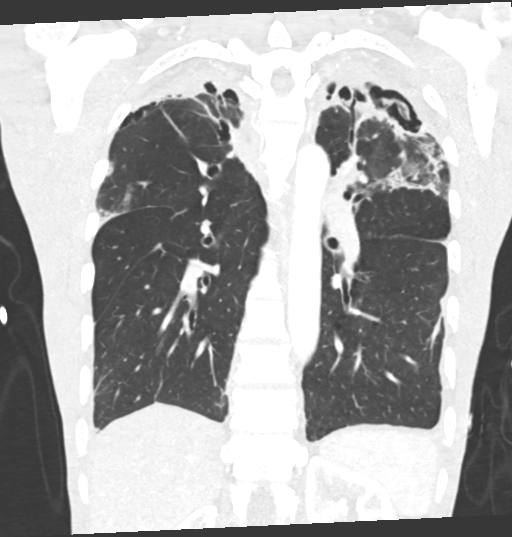

[15 of 36 positions shown; findings below may reference images not displayed]

FINDINGS: Cardiovascular: Aorta normal caliber. Thoracic vascular structures
patent and unremarkable. Heart normal appearance. No pericardial
effusion.

Mediastinum/Nodes: Esophagus normal appearance. Base of cervical
region normal appearance. Small calcified precarinal and RIGHT hilar
lymph nodes. No adenopathy.

Lungs/Pleura: Bibasilar scarring. Tree-in-bud reticulonodular
infiltrates in the LEFT lower lobe and to a lesser degree LEFT upper
lobe adjacent to the major fissure either representing bronchiolitis
or infection. Central peribronchial thickening. Extensive
pleuroparenchymal opacities at both lung apices with multiple
cavitary foci particularly in LEFT upper lobe. A dominant cavity in
the LEFT upper lobe measures 5.1 x 2.5 x 2.7 cm and contains central
internal material which could represent mycetoma. Parenchymal
calcification at RIGHT apex. No consolidation at posterior aspect of
LEFT upper lobe. Minimal patchy infiltrate RIGHT upper lobe
laterally. Minimal bronchiectasis LEFT upper lobe. Scattered pleural
calcification in the hemithoraces bilaterally which could be related
to asbestos exposure, TB, or prior empyema/pneumothorax. Posterior
pleural thickening at the lung bases. No dominant pulmonary mass.
Lungs demonstrate panlobular emphysema.

Upper Abdomen: Small hepatic cysts. Remaining visualized upper
abdomen unremarkable.

Musculoskeletal: No acute osseous findings.
IMPRESSION: Pleural calcifications bilaterally which could be related to
asbestos exposure TB, less likely prior empyema or hemothorax
bilaterally.

Extensive postinflammatory scarring at the upper lobes with
cavitation and minimal calcification, could reflect a pneumoconiosis
or prior infection such as TB.

Dominant cavitation in the LEFT upper lobe 5.1 x 2.5 x 2.7 cm
contains nodular internal material question mycetoma/superimposed
fungal infection.

Additional reticulonodular tree-in-bud opacities in the LEFT lower
lobe, less in BILATERAL upper lobes, question bronchiolitis or
infection.

Emphysema ([id number]).

## 2019-09-14 IMAGING — CR DG CHEST 2V
2 series · 2 of 2 positions shown · non-contrast
Comparison: 04/29/2018

CLINICAL DATA: sent by [REDACTED] to rule out TB

EXAM:
CHEST - 2 VIEW

[chest pa]
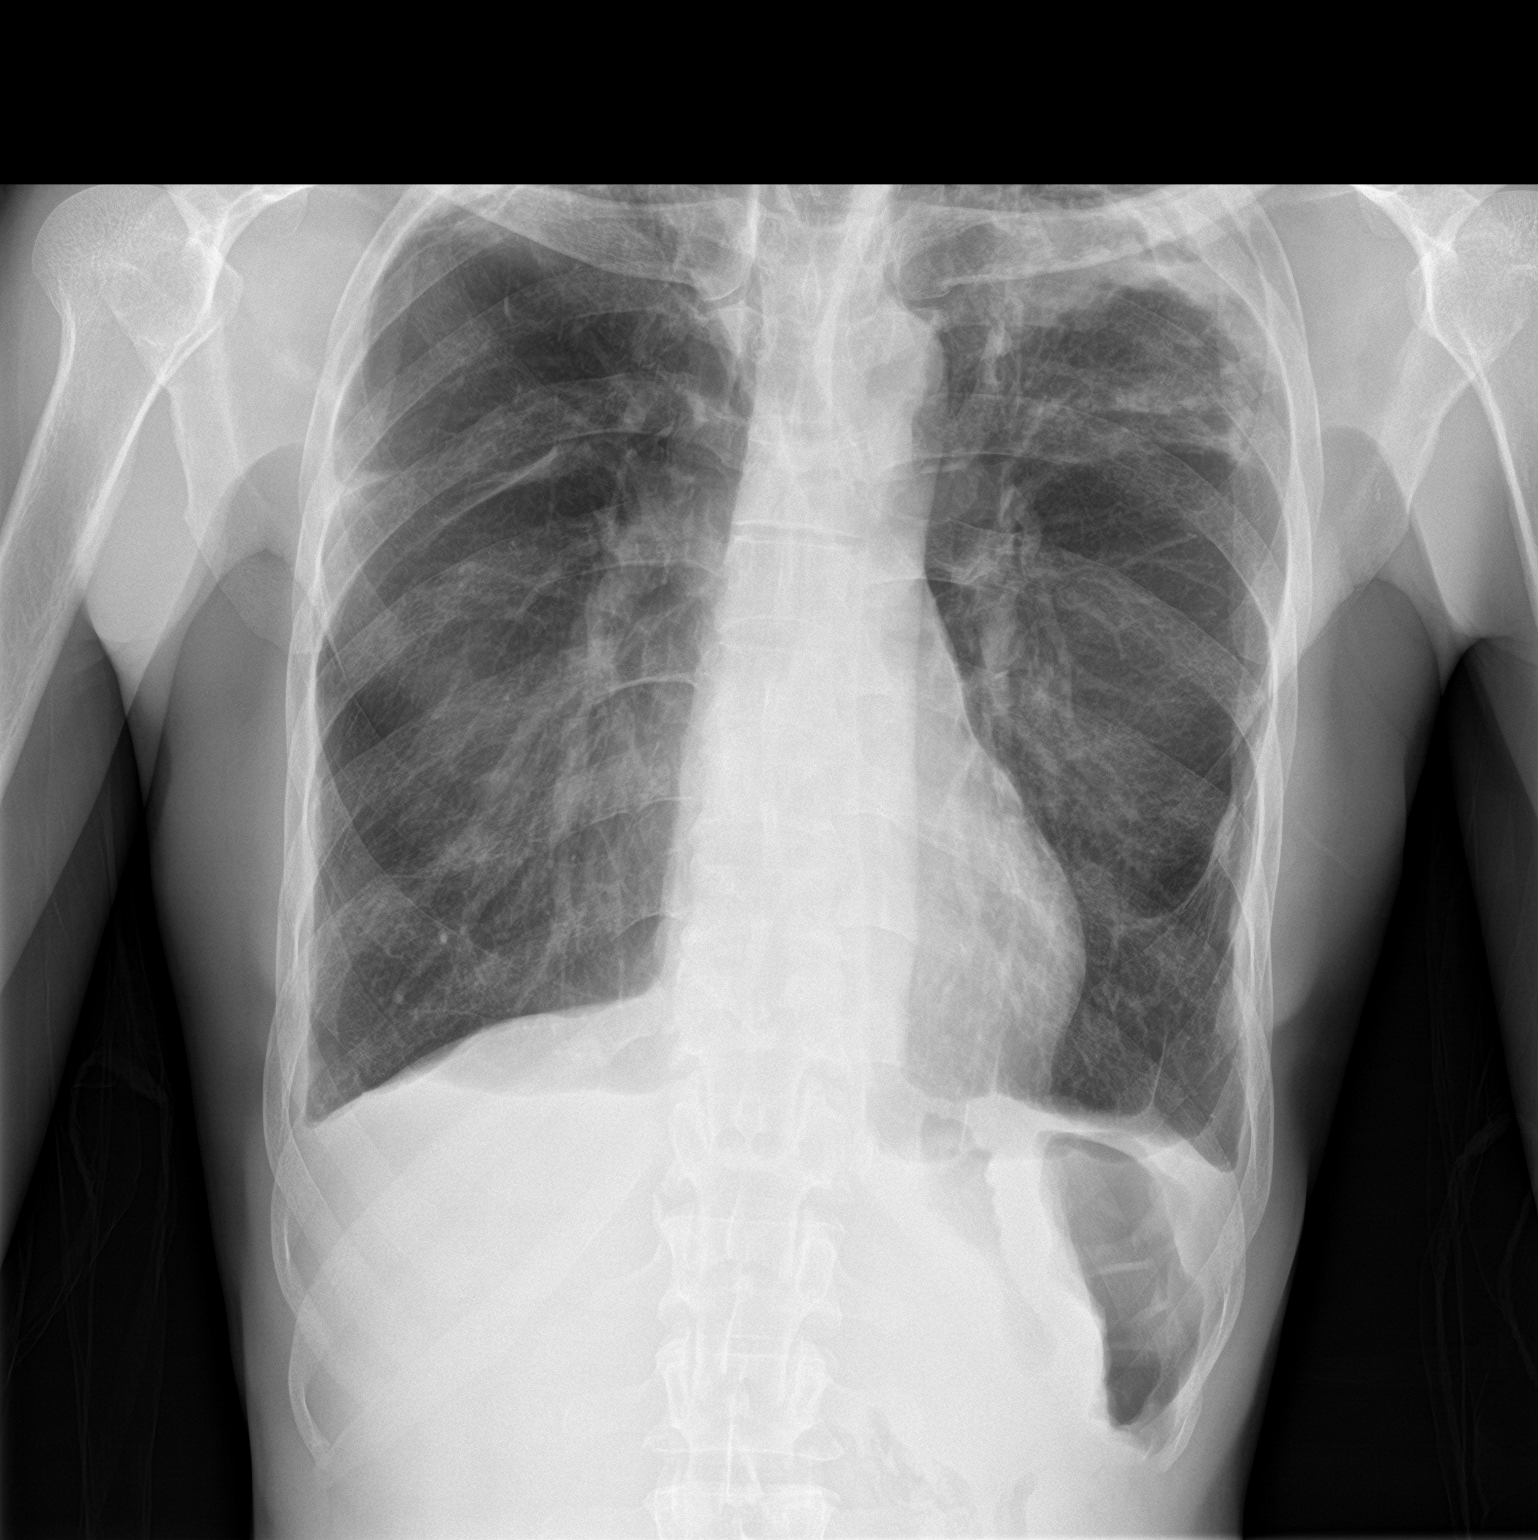

[chest lat]
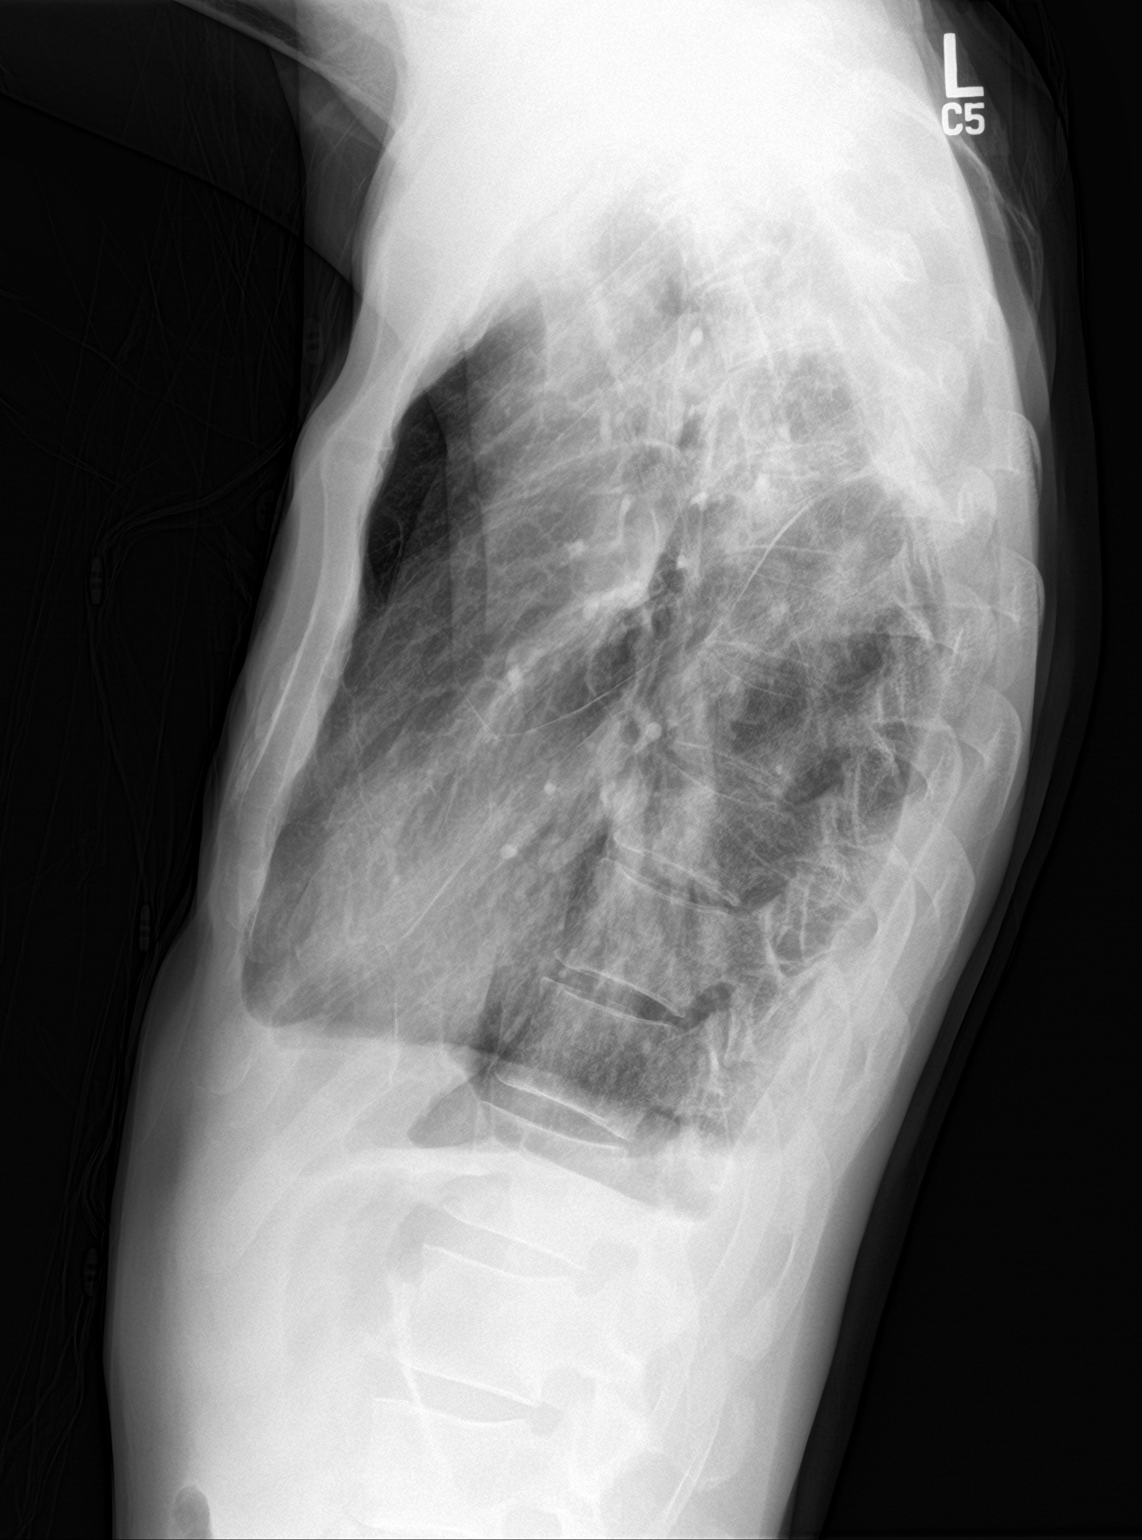

[2 of 2 positions shown; findings below may reference images not displayed]

FINDINGS: Stable extensive densities along the lung apices including some
bilateral upward hilar retraction and left greater than right
suprahilar densities.

Large lung volumes. Scattered scarring in both lungs. Blunting of
the posterior costophrenic angles. Large lung volumes. Heart size
within normal limits.
IMPRESSION: 1. Biapical marked pleural thickening and irregular opacities, not
appreciably changed from the earlier exam, but mildly increased from
11/01/2014. Appearance could be from pneumoconiosis with progressive
massive fibrosis, scarring related to tuberculosis/old granulomatous
disease is not readily excluded. Given the increase since 2368, I
would recommend chest CT is a baseline and to help exclude any
active process adjacent to the pleuroparenchymal scarring in the
apices.
2. Scarring in the bilateral mid lungs.
3.  Emphysema ([id number]).

## 2019-09-14 IMAGING — DX DG CHEST 2V
2 series · 2 of 2 positions shown · non-contrast
Comparison: 11/01/2014, 07/25/2006.

CLINICAL DATA: 62-year-old with chronic persistent cough and recent
unexplained weight loss.

EXAM:
CHEST - 2 VIEW

[chest pa]
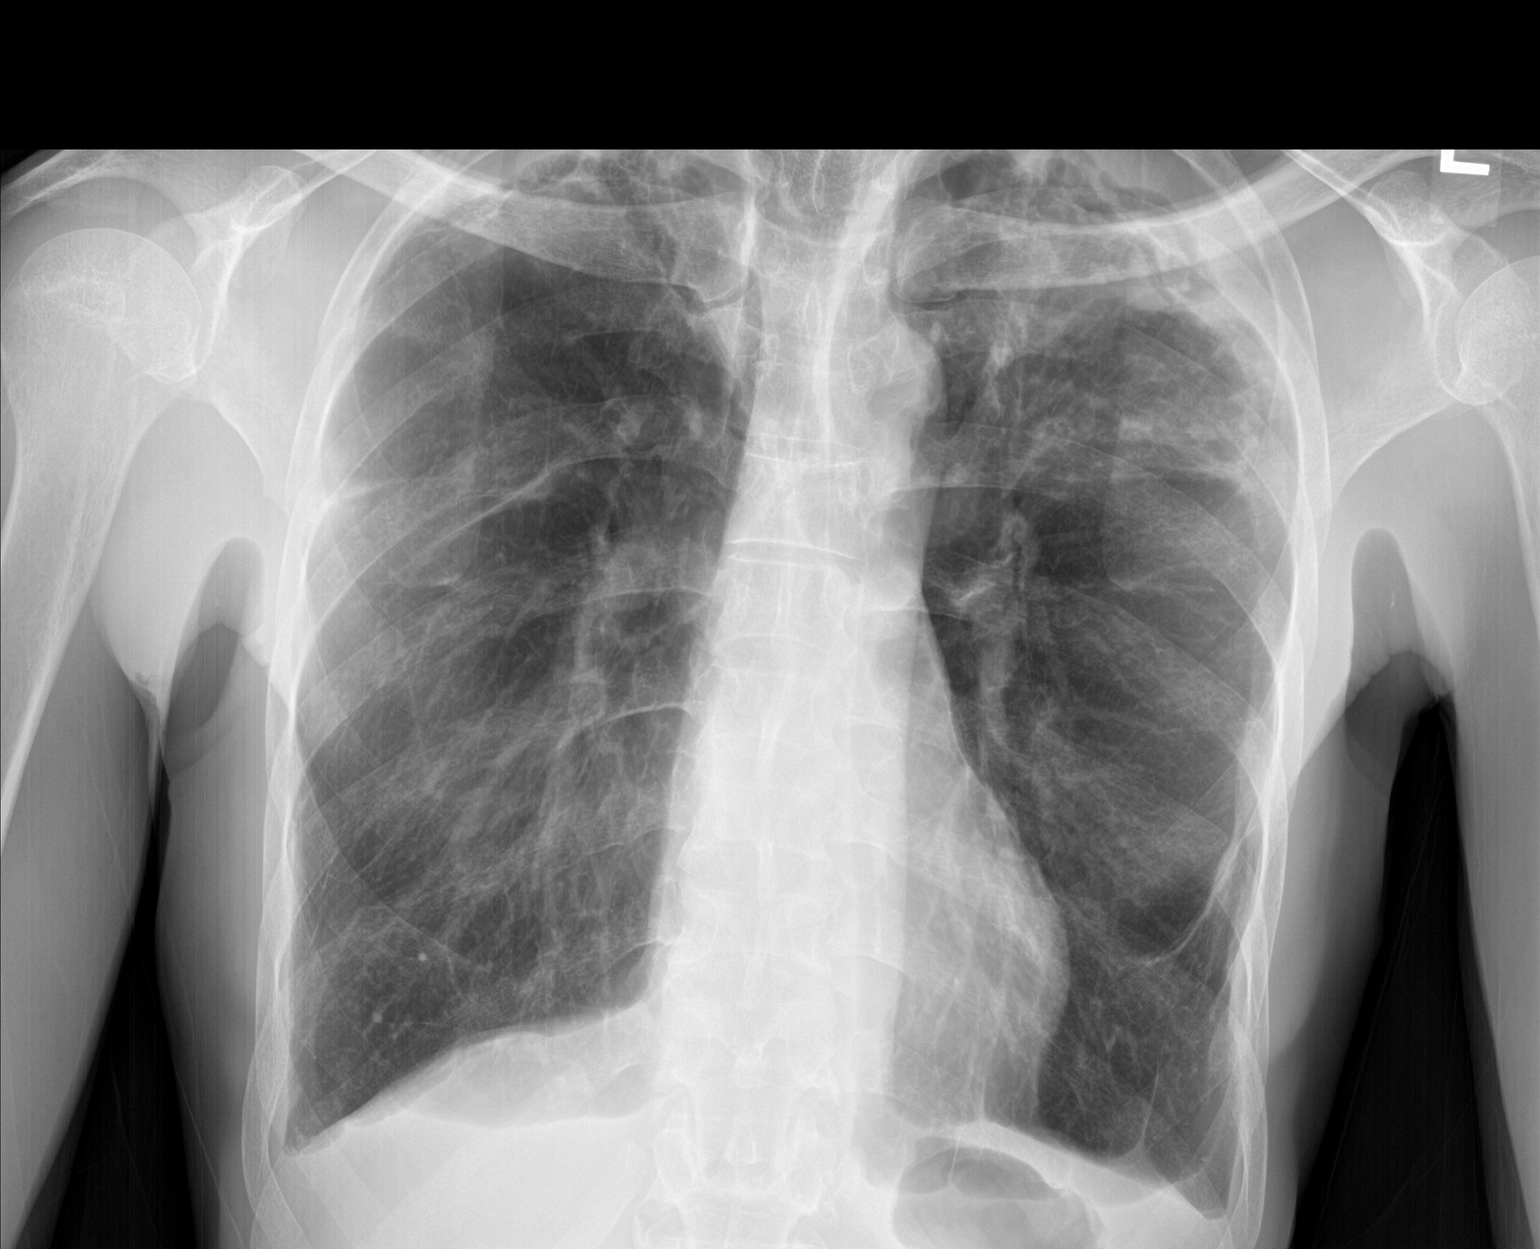

[chest lat]
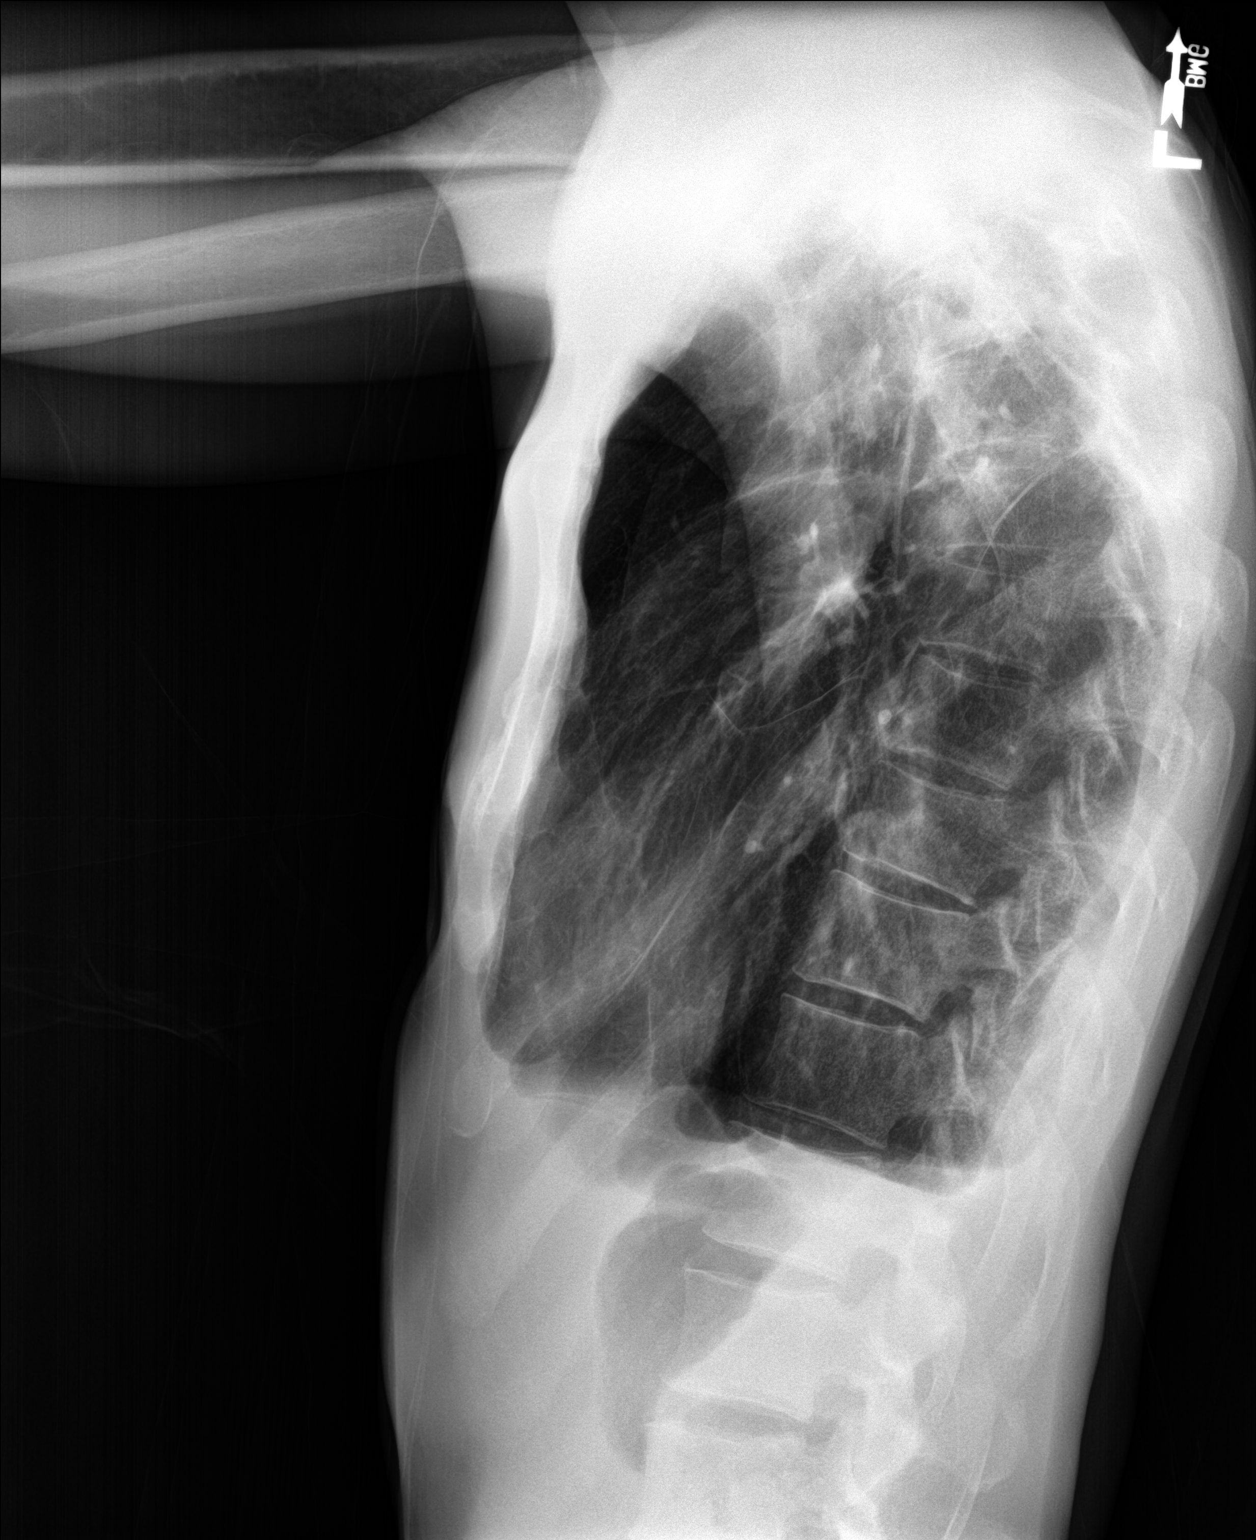

[2 of 2 positions shown; findings below may reference images not displayed]

FINDINGS: Cardiomediastinal silhouette unremarkable, unchanged. Severe bullous
emphysematous changes throughout both lungs, unchanged. Extensive
biapical pleuroparenchymal scarring and moderate pleuroparenchymal
scarring at the bases, unchanged. New patchy and streaky airspace
opacities in the LEFT UPPER LOBE. No new pulmonary parenchymal
abnormalities elsewhere.
IMPRESSION: 1. LEFT UPPER LOBE pneumonia.
2.  Emphysema. ([id number])
3. Chronic biapical and bibasilar pleuroparenchymal scarring.

## 2019-11-08 ENCOUNTER — Ambulatory Visit (INDEPENDENT_AMBULATORY_CARE_PROVIDER_SITE_OTHER)
Admission: RE | Admit: 2019-11-08 | Discharge: 2019-11-08 | Disposition: A | Discharge status: Home / Self Care | Payer: 59 | Source: Ambulatory Visit | Attending: Primary Care | Admitting: Primary Care

## 2019-11-08 ENCOUNTER — Inpatient Hospital Stay: Admission: RE | Admit: 2019-11-08 | Payer: 59 | Source: Ambulatory Visit

## 2019-11-08 ENCOUNTER — Other Ambulatory Visit: Payer: Self-pay

## 2019-11-08 DIAGNOSIS — J984 Other disorders of lung: Secondary | ICD-10-CM

## 2019-11-08 NOTE — Progress Notes (Signed)
I spoke with patient and reviewed results. Overall, stable scan. Due for regular follow-up with Dr. Delton Coombes in the next 3 months. Thanks

## 2019-11-09 ENCOUNTER — Ambulatory Visit (INDEPENDENT_AMBULATORY_CARE_PROVIDER_SITE_OTHER): Payer: 59 | Admitting: Emergency Medicine

## 2019-11-09 ENCOUNTER — Encounter: Payer: Self-pay | Admitting: Emergency Medicine

## 2019-11-09 ENCOUNTER — Other Ambulatory Visit: Payer: Self-pay

## 2019-11-09 VITALS — BP 123/76 | HR 86 | Temp 98.0°F | Resp 16 | Ht 75.0 in | Wt 145.0 lb

## 2019-11-09 DIAGNOSIS — Z0001 Encounter for general adult medical examination with abnormal findings: Secondary | ICD-10-CM | POA: Diagnosis not present

## 2019-11-09 DIAGNOSIS — Z23 Encounter for immunization: Secondary | ICD-10-CM | POA: Diagnosis not present

## 2019-11-09 DIAGNOSIS — R7303 Prediabetes: Secondary | ICD-10-CM | POA: Diagnosis not present

## 2019-11-09 DIAGNOSIS — Z13220 Encounter for screening for lipoid disorders: Secondary | ICD-10-CM

## 2019-11-09 DIAGNOSIS — Z1329 Encounter for screening for other suspected endocrine disorder: Secondary | ICD-10-CM

## 2019-11-09 DIAGNOSIS — J439 Emphysema, unspecified: Secondary | ICD-10-CM

## 2019-11-09 DIAGNOSIS — Z130 Encounter for screening for diseases of the blood and blood-forming organs and certain disorders involving the immune mechanism: Secondary | ICD-10-CM

## 2019-11-09 DIAGNOSIS — Z13228 Encounter for screening for other metabolic disorders: Secondary | ICD-10-CM

## 2019-11-09 NOTE — Addendum Note (Signed)
Addended by: Georg Ruddle A on: 11/09/2019 10:26 AM   Modules accepted: Orders

## 2019-11-09 NOTE — Patient Instructions (Addendum)
   If you have lab work done today you will be contacted with your lab results within the next 2 weeks.  If you have not heard from us then please contact us. The fastest way to get your results is to register for My Chart.   IF you received an x-ray today, you will receive an invoice from [facility]. Please contact  Radiology at [phone] with questions or concerns regarding your invoice.   IF you received labwork today, you will receive an invoice from [facility]. Please contact [facility] at [phone] with questions or concerns regarding your invoice.   Our billing staff will not be able to assist you with questions regarding bills from these companies.  You will be contacted with the lab results as soon as they are available. The fastest way to get your results is to activate your My Chart account. Instructions are located on the last page of this paperwork. If you have not heard from us regarding the results in 2 weeks, please contact this office.      Health Maintenance, Male Adopting a healthy lifestyle and getting preventive care are important in promoting health and wellness. Ask your health care provider about:  The right schedule for you to have regular tests and exams.  Things you can do on your own to prevent diseases and keep yourself healthy. What should I know about diet, weight, and exercise? Eat a healthy diet   Eat a diet that includes plenty of vegetables, fruits, low-fat dairy products, and lean protein.  Do not eat a lot of foods that are high in solid fats, added sugars, or sodium. Maintain a healthy weight Body mass index (BMI) is a measurement that can be used to identify possible weight problems. It estimates body fat based on height and weight. Your health care provider can help determine your BMI and help you achieve or maintain a healthy weight. Get regular exercise Get regular exercise. This is one of the most important things you  can do for your health. Most adults should:  Exercise for at least 150 minutes each week. The exercise should increase your heart rate and make you sweat (moderate-intensity exercise).  Do strengthening exercises at least twice a week. This is in addition to the moderate-intensity exercise.  Spend less time sitting. Even light physical activity can be beneficial. Watch cholesterol and blood lipids Have your blood tested for lipids and cholesterol at 63 years of age, then have this test every 5 years. You may need to have your cholesterol levels checked more often if:  Your lipid or cholesterol levels are high.  You are older than 63 years of age.  You are at high risk for heart disease. What should I know about cancer screening? Many types of cancers can be detected early and may often be prevented. Depending on your health history and family history, you may need to have cancer screening at various ages. This may include screening for:  Colorectal cancer.  Prostate cancer.  Skin cancer.  Lung cancer. What should I know about heart disease, diabetes, and high blood pressure? Blood pressure and heart disease  High blood pressure causes heart disease and increases the risk of stroke. This is more likely to develop in people who have high blood pressure readings, are of African descent, or are overweight.  Talk with your health care provider about your target blood pressure readings.  Have your blood pressure checked: ? Every 3-5 years if you are 18-39   years of age. ? Every year if you are 40 years old or older.  If you are between the ages of 65 and 75 and are a current or former smoker, ask your health care provider if you should have a one-time screening for abdominal aortic aneurysm (AAA). Diabetes Have regular diabetes screenings. This checks your fasting blood sugar level. Have the screening done:  Once every three years after age 45 if you are at a normal weight and have  a low risk for diabetes.  More often and at a younger age if you are overweight or have a high risk for diabetes. What should I know about preventing infection? Hepatitis B If you have a higher risk for hepatitis B, you should be screened for this virus. Talk with your health care provider to find out if you are at risk for hepatitis B infection. Hepatitis C Blood testing is recommended for:  Everyone born from 1945 through 1965.  Anyone with known risk factors for hepatitis C. Sexually transmitted infections (STIs)  You should be screened each year for STIs, including gonorrhea and chlamydia, if: ? You are sexually active and are younger than 63 years of age. ? You are older than 63 years of age and your health care provider tells you that you are at risk for this type of infection. ? Your sexual activity has changed since you were last screened, and you are at increased risk for chlamydia or gonorrhea. Ask your health care provider if you are at risk.  Ask your health care provider about whether you are at high risk for HIV. Your health care provider may recommend a prescription medicine to help prevent HIV infection. If you choose to take medicine to prevent HIV, you should first get tested for HIV. You should then be tested every 3 months for as long as you are taking the medicine. Follow these instructions at home: Lifestyle  Do not use any products that contain nicotine or tobacco, such as cigarettes, e-cigarettes, and chewing tobacco. If you need help quitting, ask your health care provider.  Do not use street drugs.  Do not share needles.  Ask your health care provider for help if you need support or information about quitting drugs. Alcohol use  Do not drink alcohol if your health care provider tells you not to drink.  If you drink alcohol: ? Limit how much you have to 0-2 drinks a day. ? Be aware of how much alcohol is in your drink. In the [place] one drink equals one 12  oz bottle of beer (355 mL), one 5 oz glass of wine (148 mL), or one 1 oz glass of hard liquor (44 mL). General instructions  Schedule regular health, dental, and eye exams.  Stay current with your vaccines.  Tell your health care provider if: ? You often feel depressed. ? You have ever been abused or do not feel safe at home. Summary  Adopting a healthy lifestyle and getting preventive care are important in promoting health and wellness.  Follow your health care provider's instructions about healthy diet, exercising, and getting tested or screened for diseases.  Follow your health care provider's instructions on monitoring your cholesterol and blood pressure. This information is not intended to replace advice given to you by your health care provider. Make sure you discuss any questions you have with your health care provider. Document Revised: 07/29/2018 Document Reviewed: 07/29/2018 Elsevier Patient Education  2020 Elsevier Inc.  

## 2019-11-09 NOTE — Progress Notes (Signed)
Raymond Moon 64 y.o.   Chief Complaint  Patient presents with   Annual Exam    HISTORY OF PRESENT ILLNESS: This is a 64 y.o. male here for annual exam. Past medical history of COPD mostly emphysema. Recent CT scan of the chest shows the following: CT Super D Chest Wo Contrast  Result Date: 11/08/2019 CLINICAL DATA:  Follow-up cavitary lesion in the left upper lobe. EXAM: CT CHEST WITHOUT CONTRAST TECHNIQUE: Multidetector CT imaging of the chest was performed using thin slice collimation for electromagnetic bronchoscopy planning purposes, without intravenous contrast. COMPARISON:  10/29/2018 FINDINGS: Cardiovascular: The heart is normal in size. No pericardial effusion. Stable mild tortuosity of the thoracic aorta but no aneurysm or atherosclerotic calcifications. No obvious coronary artery calcifications. Mediastinum/Nodes: Stable scattered mediastinal and hilar lymph nodes, some of which are calcified. No new or progressive findings. The esophagus is grossly normal. Lungs/Pleura: Stable chronic pleural and parenchymal scarring changes, most significantly at the lung apices with bullous changes and bronchiectasis. 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 No new apical lesions. 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 Small pleural effusions are noted. Stable ill-defined area of nodularity in the right lower lobe on image 94/3 with some surrounding bronchovascular nodularity. Dense linear scarring type changes at both lung bases appears stable. Nodularity just above the right hemidiaphragm is again noted along with a few small calcified granulomas. Upper Abdomen: No significant upper abdominal findings are identified. Stable left hepatic lobe cyst. Musculoskeletal: No chest wall mass, supraclavicular or axillary adenopathy. The thyroid gland is grossly normal. The bony thorax is intact. Mild  scoliosis but no bone lesions. IMPRESSION: 1. 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 2. 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 3. Stable calcified mediastinal and hilar lymph nodes. 4. Stable ill-defined area of nodularity in the right lower lobe with some surrounding bronchovascular nodularity. 5. Stable emphysematous changes and bullous changes. 6. Small bilateral pleural effusions. Aortic Atherosclerosis (ICD10-I70.0) and Emphysema (ICD10-J43.9). Aortic Atherosclerosis (ICD10-I70.0) and Emphysema (ICD10-J43.9). Electronically Signed   By: Rudie Meyer M.D.   On: 11/08/2019 14:23  Has no complaints or medical concerns today.   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 No pertinent past medical history.  Past Surgical History:  Procedure Laterality Date   VIDEO BRONCHOSCOPY Bilateral 08/21/2018   Procedure: VIDEO BRONCHOSCOPY WITH FLUORO;  Surgeon: Leslye Peer, MD;  Location: [facility] ENDOSCOPY;  Service: Cardiopulmonary;  Laterality: Bilateral;    Social History   Socioeconomic History   Marital status: Married    Spouse name: Not on file   Number of children: Not on file   Years of education: Not on file   Highest education level: Not on file  Occupational History   Not on file  Tobacco Use   Smoking status: Never Smoker   Smokeless tobacco: Never Used  Substance and Sexual Activity   Alcohol use: Yes    Alcohol/week: 0.0 standard drinks   Drug use: No   Sexual activity: Yes    Partners: Male  Other Topics Concern  Not on file  Social History Narrative   Not on file   Social Determinants of Health    Financial Resource Strain:    Difficulty of Paying Living Expenses:   Food Insecurity:    Worried About Programme researcher, broadcasting/film/videounning Out of Food in the Last Year:    Baristaan Out of Food in the Last Year:   Transportation Needs:    Freight forwarderLack of Transportation (Medical):    Lack of Transportation (Non-Medical):   Physical Activity:    Days of Exercise per Week:    Minutes of Exercise per Session:   Stress:    Feeling of Stress :   Social Connections:    Frequency of Communication with Friends and Family:    Frequency of Social Gatherings with Friends and Family:    Attends Religious Services:    Active Member of Clubs or Organizations:    Attends Engineer, structuralClub or Organization Meetings:    Marital Status:   Intimate Partner Violence:    Fear of Current or Ex-Partner:    Emotionally Abused:    Physically Abused:    Sexually Abused:     History reviewed. No pertinent family history.   Review of Systems  Constitutional: Negative.  Negative for chills, fever and weight loss.  HENT: Negative.  Negative for congestion and sore throat.   Respiratory: Negative.  Negative for cough, hemoptysis and shortness of breath.   Cardiovascular: Negative.  Negative for chest pain and palpitations.  Gastrointestinal: Negative for abdominal pain, blood in stool, diarrhea, melena, nausea and vomiting.  Genitourinary: Negative.  Negative for dysuria and hematuria.  Musculoskeletal: Negative.  Negative for back pain, myalgias and neck pain.  Skin: Negative.   Neurological: Negative.  Negative for dizziness and headaches.  Endo/Heme/Allergies: Negative.   All other systems reviewed and are negative.  Today's Vitals   11/09/19 0932  BP: 123/76  Pulse: 86  Resp: 16  Temp: 98 F (36.7 C)  TempSrc: Temporal  SpO2: 100%  Weight: 145 lb (65.8 kg)  Height: 6\' 3"  (1.905 m)   Body mass index is 18.12 kg/m.   Physical Exam Vitals reviewed.  Constitutional:      Appearance: Normal appearance.     Comments:  thin  HENT:     Head: Normocephalic.  Eyes:     Extraocular Movements: Extraocular movements intact.     Conjunctiva/sclera: Conjunctivae normal.     Pupils: Pupils are equal, round, and reactive to light.  Cardiovascular:     Rate and Rhythm: Normal rate and regular rhythm.     Pulses: Normal pulses.     Heart sounds: Normal heart sounds.  Pulmonary:     Effort: Pulmonary effort is normal.     Breath sounds: Normal breath sounds.  Abdominal:     General: There is no distension.     Palpations: Abdomen is soft. There is no mass.     Tenderness: There is no abdominal tenderness.  Musculoskeletal:        General: Normal range of motion.     Cervical back: Normal range of motion and neck supple.  Skin:    General: Skin is warm and dry.     Capillary Refill: Capillary refill takes less than 2 seconds.  Neurological:     General: No focal deficit present.     Mental Status: He is alert and oriented to person, place, and time.  Psychiatric:        Mood and Affect: Mood normal.  Behavior: Behavior normal.      ASSESSMENT & PLAN: Raymond Moon was seen today for annual exam.  Diagnoses and all orders for this visit:  Encounter for general adult medical examination with abnormal findings  Bullous emphysema (HCC) -     Comprehensive metabolic panel -     CBC with Differential/Platelet  Prediabetes -     Comprehensive metabolic panel -     Hemoglobin A1c  Screening for deficiency anemia  Screening for lipoid disorders -     Lipid panel -     CBC with Differential/Platelet  Screening for endocrine, metabolic and immunity disorder -     TSH    Patient Instructions       If you have lab work done today you will be contacted with your lab results within the next 2 weeks.  If you have not heard from [place] then please contact us. The fastest way to get your results is to register for My Chart.   IF you received an x-ray today, you will receive an invoice from  [facility]. Please contact [facility] at [phone] with questions or concerns regarding your invoice.   IF you received labwork today, you will receive an invoice from [facility]. Please contact [facility] at [phone] with questions or concerns regarding your invoice.   Our billing staff will not be able to assist you with questions regarding bills from these companies.  You will be contacted with the lab results as soon as they are available. The fastest way to get your results is to activate your My Chart account. Instructions are located on the last page of this paperwork. If you have not heard from [place] regarding the results in 2 weeks, please contact this office.      Health Maintenance, Male Adopting a healthy lifestyle and getting preventive care are important in promoting health and wellness. Ask your health care provider about:  The right schedule for you to have regular tests and exams.  Things you can do on your own to prevent diseases and keep yourself healthy. What should I know about diet, weight, and exercise? Eat a healthy diet   Eat a diet that includes plenty of vegetables, fruits, low-fat dairy products, and lean protein.  Do not eat a lot of foods that are high in solid fats, added sugars, or sodium. Maintain a healthy weight Body mass index (BMI) is a measurement that can be used to identify possible weight problems. It estimates body fat based on height and weight. Your health care provider can help determine your BMI and help you achieve or maintain a healthy weight. Get regular exercise Get regular exercise. This is one of the most important things you can do for your health. Most adults should:  Exercise for at least 150 minutes each week. The exercise should increase your heart rate and make you sweat (moderate-intensity exercise).  Do strengthening exercises at least twice a week. This is in addition to the moderate-intensity  exercise.  Spend less time sitting. Even light physical activity can be beneficial. Watch cholesterol and blood lipids Have your blood tested for lipids and cholesterol at 63 years of age, then have this test every 5 years. You may need to have your cholesterol levels checked more often if:  Your lipid or cholesterol levels are high.  You are older than 63 years of age.  You are at high risk for heart disease. What should I know about cancer screening? Many types of cancers can  be detected early and may often be prevented. Depending on your health history and family history, you may need to have cancer screening at various ages. This may include screening for:  Colorectal cancer.  Prostate cancer.  Skin cancer.  Lung cancer. What should I know about heart disease, diabetes, and high blood pressure? Blood pressure and heart disease  High blood pressure causes heart disease and increases the risk of stroke. This is more likely to develop in people who have high blood pressure readings, are of African descent, or are overweight.  Talk with your health care provider about your target blood pressure readings.  Have your blood pressure checked: ? Every 3-5 years if you are 72-69 years of age. ? Every year if you are 58 years old or older.  If you are between the ages of 12 and 66 and are a current or former smoker, ask your health care provider if you should have a one-time screening for abdominal aortic aneurysm (AAA). Diabetes Have regular diabetes screenings. This checks your fasting blood sugar level. Have the screening done:  Once every three years after age 35 if you are at a normal weight and have a low risk for diabetes.  More often and at a younger age if you are overweight or have a high risk for diabetes. What should I know about preventing infection? Hepatitis B If you have a higher risk for hepatitis B, you should be screened for this virus. Talk with your health care  provider to find out if you are at risk for hepatitis B infection. Hepatitis C Blood testing is recommended for:  Everyone born from [birth year] through 1965.  Anyone with known risk factors for hepatitis C. Sexually transmitted infections (STIs)  You should be screened each year for STIs, including gonorrhea and chlamydia, if: ? You are sexually active and are younger than 63 years of age. ? You are older than 63 years of age and your health care provider tells you that you are at risk for this type of infection. ? Your sexual activity has changed since you were last screened, and you are at increased risk for chlamydia or gonorrhea. Ask your health care provider if you are at risk.  Ask your health care provider about whether you are at high risk for HIV. Your health care provider may recommend a prescription medicine to help prevent HIV infection. If you choose to take medicine to prevent HIV, you should first get tested for HIV. You should then be tested every 3 months for as long as you are taking the medicine. Follow these instructions at home: Lifestyle  Do not use any products that contain nicotine or tobacco, such as cigarettes, e-cigarettes, and chewing tobacco. If you need help quitting, ask your health care provider.  Do not use street drugs.  Do not share needles.  Ask your health care provider for help if you need support or information about quitting drugs. Alcohol use  Do not drink alcohol if your health care provider tells you not to drink.  If you drink alcohol: ? Limit how much you have to 0-2 drinks a day. ? Be aware of how much alcohol is in your drink. In the [place] one drink equals one 12 oz bottle of beer (355 mL), one 5 oz glass of wine (148 mL), or one 1 oz glass of hard liquor (44 mL). General instructions  Schedule regular health, dental, and eye exams.  Stay current with your vaccines.  Tell  your health care provider if: ? You often feel depressed. ? You  have ever been abused or do not feel safe at home. Summary  Adopting a healthy lifestyle and getting preventive care are important in promoting health and wellness.  Follow your health care provider's instructions about healthy diet, exercising, and getting tested or screened for diseases.  Follow your health care provider's instructions on monitoring your cholesterol and blood pressure. This information is not intended to replace advice given to you by your health care provider. Make sure you discuss any questions you have with your health care provider. Document Revised: 07/29/2018 Document Reviewed: 07/29/2018 Elsevier Patient Education  2020 Elsevier Inc.      Edwina Barth, MD [facility]

## 2019-11-10 ENCOUNTER — Encounter: Payer: Self-pay | Admitting: Emergency Medicine

## 2019-11-10 LAB — COMPREHENSIVE METABOLIC PANEL
ALT: 11 IU/L (ref 0–44)
AST: 24 IU/L (ref 0–40)
Albumin/Globulin Ratio: 1 — ABNORMAL LOW (ref 1.2–2.2)
Albumin: 4.1 g/dL (ref 3.8–4.8)
Alkaline Phosphatase: 84 IU/L (ref 39–117)
BUN/Creatinine Ratio: 16 (ref 10–24)
BUN: 18 mg/dL (ref 8–27)
Bilirubin Total: 0.5 mg/dL (ref 0.0–1.2)
CO2: 26 mmol/L (ref 20–29)
Calcium: 9.7 mg/dL (ref 8.6–10.2)
Chloride: 97 mmol/L (ref 96–106)
Creatinine, Ser: 1.16 mg/dL (ref 0.76–1.27)
GFR calc Af Amer: 78 mL/min/{1.73_m2} (ref >59)
GFR calc non Af Amer: 67 mL/min/{1.73_m2} (ref >59)
Globulin, Total: 4.2 g/dL (ref 1.5–4.5)
Glucose: 70 mg/dL (ref 65–99)
Potassium: 4.6 mmol/L (ref 3.5–5.2)
Sodium: 137 mmol/L (ref 134–144)
Total Protein: 8.3 g/dL (ref 6.0–8.5)

## 2019-11-10 LAB — CBC WITH DIFFERENTIAL/PLATELET
Basophils Absolute: 0 10*3/uL (ref 0.0–0.2)
Basos: 1 %
EOS (ABSOLUTE): 0.5 10*3/uL — ABNORMAL HIGH (ref 0.0–0.4)
Eos: 8 %
Hematocrit: 44.6 % (ref 37.5–51.0)
Hemoglobin: 14.5 g/dL (ref 13.0–17.7)
Immature Grans (Abs): 0 10*3/uL (ref 0.0–0.1)
Immature Granulocytes: 0 %
Lymphocytes Absolute: 0.9 10*3/uL (ref 0.7–3.1)
Lymphs: 15 %
MCH: 27.2 pg (ref 26.6–33.0)
MCHC: 32.5 g/dL (ref 31.5–35.7)
MCV: 84 fL (ref 79–97)
Monocytes Absolute: 0.8 10*3/uL (ref 0.1–0.9)
Monocytes: 14 %
Neutrophils Absolute: 3.7 10*3/uL (ref 1.4–7.0)
Neutrophils: 62 %
Platelets: 234 10*3/uL (ref 150–450)
RBC: 5.33 x10E6/uL (ref 4.14–5.80)
RDW: 13.1 % (ref 11.6–15.4)
WBC: 5.9 10*3/uL (ref 3.4–10.8)

## 2019-11-10 LAB — HEMOGLOBIN A1C
Est. average glucose Bld gHb Est-mCnc: 128 mg/dL
Hgb A1c MFr Bld: 6.1 % — ABNORMAL HIGH (ref 4.8–5.6)

## 2019-11-10 LAB — LIPID PANEL
Chol/HDL Ratio: 2.5 ratio (ref 0.0–5.0)
Cholesterol, Total: 192 mg/dL (ref 100–199)
HDL: 77 mg/dL (ref >39)
LDL Chol Calc (NIH): 106 mg/dL — ABNORMAL HIGH (ref 0–99)
Triglycerides: 46 mg/dL (ref 0–149)
VLDL Cholesterol Cal: 9 mg/dL (ref 5–40)

## 2019-11-10 LAB — TSH: TSH: 2.03 u[IU]/mL (ref 0.450–4.500)

## 2020-01-06 IMAGING — CR DG CHEST 1V PORT
1 series · 1 of 1 positions shown · non-contrast
Comparison: CT 06/29/2018.

CLINICAL DATA: Bronchoscopy with biopsy.

EXAM:
PORTABLE CHEST 1 VIEW

[AP]
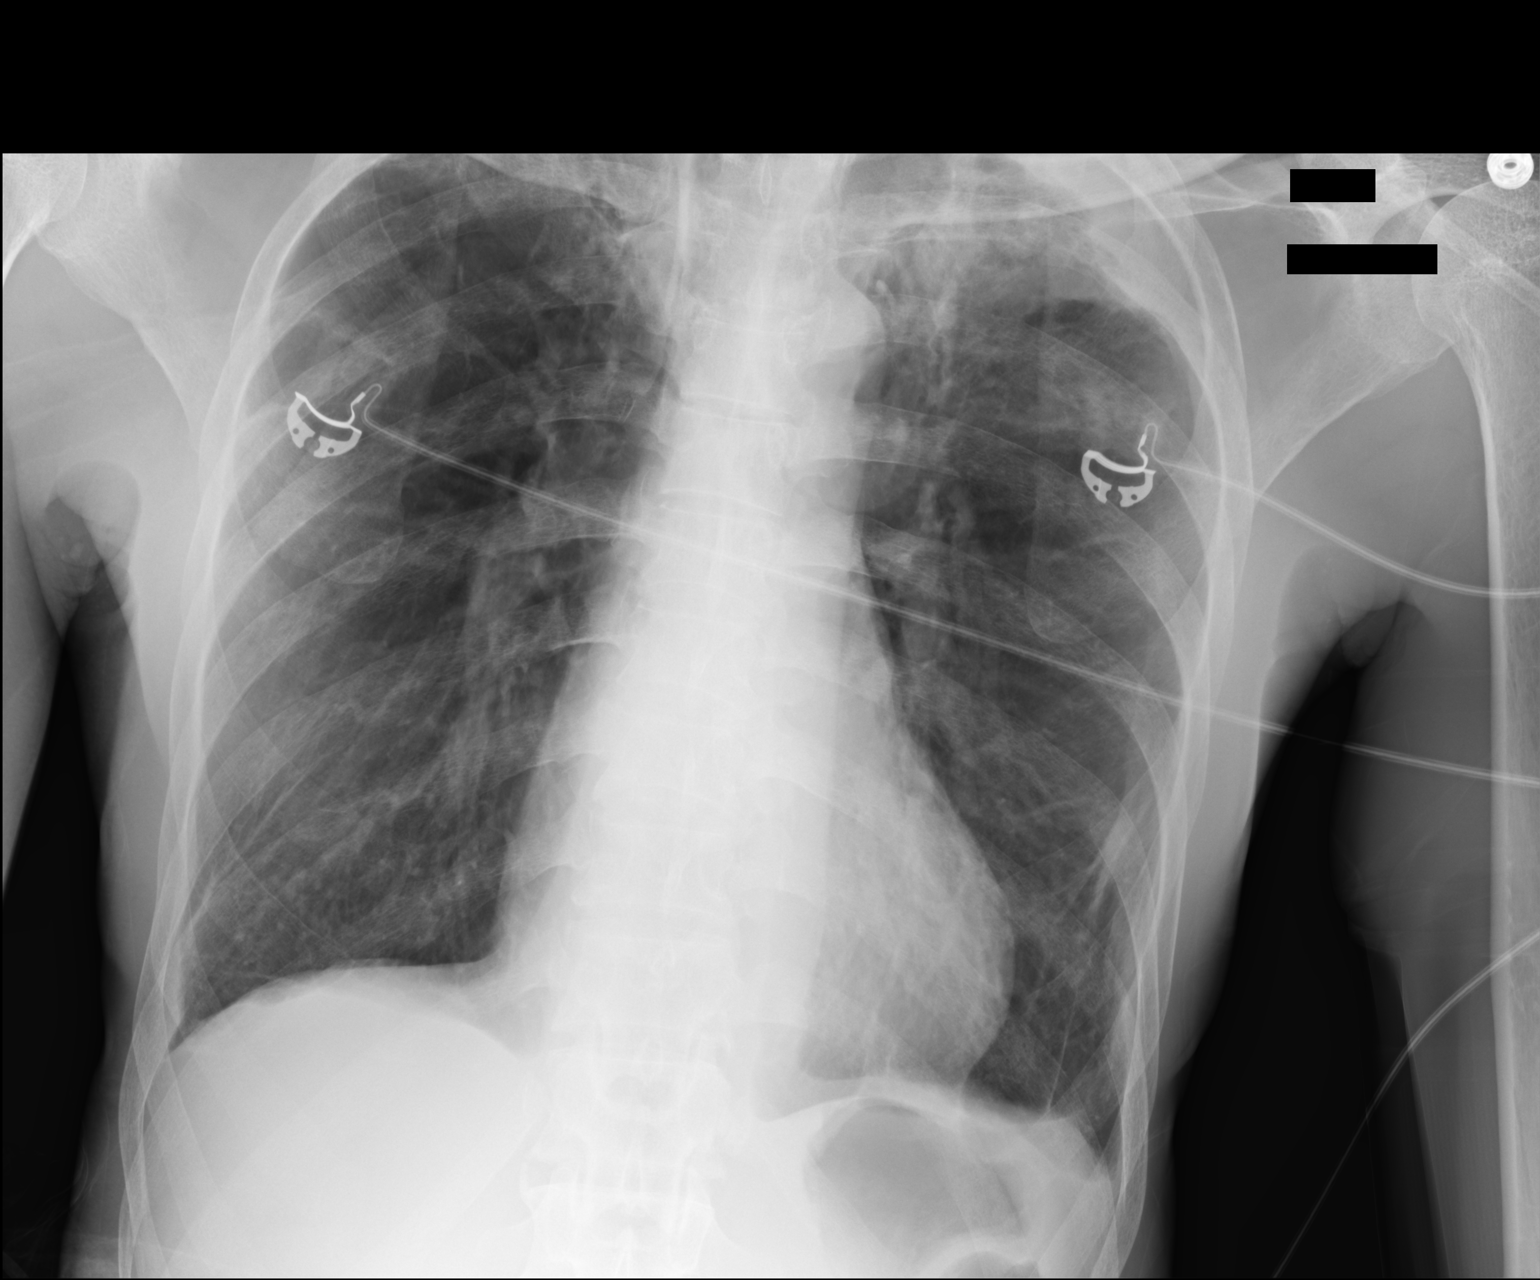

[1 of 1 positions shown; findings below may reference images not displayed]

FINDINGS: Mediastinum hilar structures stable. Bilateral upper lobe
pleural-parenchymal thickening again noted consistent with scarring.
Hilar retraction noted. These findings again consistent with
scarring. Bibasilar pleural-parenchymal thickening is also again
noted consistent scarring. No pleural effusion or pneumothorax.
Pleural calcifications best identified on prior CT. No acute
abnormality identified. Heart size stable. No acute bony
abnormality.
IMPRESSION: Bilateral upper lung and bibasilar pleural-parenchymal thickening
again noted consistent scarring. Left apical mycetoma noted on prior
CT again may be present but appears no different on plain film than
on prior chest x-ray of 04/29/2018. No acute abnormality identified.

## 2020-05-09 ENCOUNTER — Ambulatory Visit (INDEPENDENT_AMBULATORY_CARE_PROVIDER_SITE_OTHER): Payer: 59 | Admitting: Emergency Medicine

## 2020-05-09 ENCOUNTER — Other Ambulatory Visit: Payer: Self-pay

## 2020-05-09 ENCOUNTER — Encounter: Payer: Self-pay | Admitting: Emergency Medicine

## 2020-05-09 VITALS — BP 130/78 | HR 86 | Temp 98.2°F | Ht 75.0 in | Wt 140.0 lb

## 2020-05-09 DIAGNOSIS — J439 Emphysema, unspecified: Secondary | ICD-10-CM

## 2020-05-09 DIAGNOSIS — I700 Atherosclerosis of aorta: Secondary | ICD-10-CM | POA: Diagnosis not present

## 2020-05-09 DIAGNOSIS — R7303 Prediabetes: Secondary | ICD-10-CM | POA: Diagnosis not present

## 2020-05-09 MED ORDER — ROSUVASTATIN CALCIUM 10 MG PO TABS: 10.0000 mg | ORAL_TABLET | Freq: Every day | ORAL | 3 refills | Status: DC

## 2020-05-09 NOTE — Patient Instructions (Addendum)
   If you have lab work done today you will be contacted with your lab results within the next 2 weeks.  If you have not heard from us then please contact us. The fastest way to get your results is to register for My Chart.   IF you received an x-ray today, you will receive an invoice from [facility]. Please contact [facility] at [phone] with questions or concerns regarding your invoice.   IF you received labwork today, you will receive an invoice from [facility]. Please contact [facility] at [phone] with questions or concerns regarding your invoice.   Our billing staff will not be able to assist you with questions regarding bills from these companies.  You will be contacted with the lab results as soon as they are available. The fastest way to get your results is to activate your My Chart account. Instructions are located on the last page of this paperwork. If you have not heard from us regarding the results in 2 weeks, please contact this office.     Atherosclerosis  Atherosclerosis is narrowing and hardening of the arteries. Arteries are blood vessels that carry blood from the heart to all parts of the body. This blood contains oxygen. Arteries can become narrow or clogged with a buildup of fat, cholesterol, calcium, and other substances (plaque). Plaque decreases the amount of blood that can flow through the artery. Atherosclerosis can affect any artery in the body, including:  Heart arteries (coronary artery disease). This may cause a heart attack.  Brain arteries. This may cause a stroke (cerebrovascular accident).  Leg, arm, and pelvis arteries (peripheral artery disease). This may cause pain and numbness.  Kidney arteries. This may cause kidney (renal) failure. Treatment may slow the disease and prevent further damage to the heart, brain, peripheral arteries, and kidneys. What are the causes? Atherosclerosis develops slowly over many years. The inner  layers of your arteries become damaged and allow the gradual buildup of plaque. The exact cause of atherosclerosis is not fully understood. Symptoms of atherosclerosis do not occur until the artery becomes narrow or blocked. What increases the risk? The following factors may make you more likely to develop this condition:  High blood pressure.  High cholesterol.  Being middle-aged or older.  Having a family history of atherosclerosis.  Having high blood fats (triglycerides).  Diabetes.  Being overweight.  Smoking tobacco.  Not exercising enough (sedentary lifestyle).  Having a substance in the blood called C-reactive protein (CRP). This is a sign of increased levels of inflammation in the body.  Sleep apnea.  Being stressed.  Drinking too much alcohol. What are the signs or symptoms? This condition may not cause any symptoms. If you have symptoms, they are caused by damage to an area of your body that is not getting enough blood.  Coronary artery disease may cause chest pain and shortness of breath.  Decreased blood supply to your brain may cause a stroke. Signs of a stroke may include sudden: ? Weakness on one side of the body. ? Confusion. ? Changes in vision. ? Inability to speak or understand speech. ? Loss of balance, coordination, or the ability to walk. ? Severe headache. ? Loss of consciousness.  Peripheral arterial disease may cause pain and numbness, often in the legs and hips.  Renal failure may cause fatigue, nausea, swelling, and itchy skin. How is this diagnosed? This condition is diagnosed based on your medical history and a physical exam. During the exam:  Your   health care provider will: ? Check your pulse in different places. ? Listen for a "whooshing" sound over your arteries (bruit).  You may have tests, such as: ? Blood tests to check your levels of cholesterol, triglycerides, and CRP. ? Electrocardiogram (ECG) to check for heart  damage. ? Chest X-ray to see if you have an enlarged heart, which is a sign of heart failure. ? Stress test to see how your heart reacts to exercise. ? Echocardiogram to get images of the inside of your heart. ? Ankle-brachial index to compare blood pressure in your arms to blood pressure in your ankles. ? Ultrasound of your peripheral arteries to check blood flow. ? CT scan to check for damage to your heart or brain. ? X-rays of blood vessels after dye has been injected (angiogram) to check blood flow. How is this treated? Treatment starts with lifestyle changes, which may include:  Changing your diet.  Losing weight.  Reducing stress.  Exercising and being physically active more regularly.  Not smoking. You may also need medicine to:  Lower triglycerides and cholesterol.  Control blood pressure.  Prevent blood clots.  Lower inflammation in your body.  Control your blood sugar. Sometimes, surgery is needed to:  Remove plaque from an artery (endarterectomy).  Open or widen a narrowed heart artery (angioplasty).  Create a new path for your blood with one of these procedures: ? Heart (coronary) artery bypass graft surgery. ? Peripheral artery bypass graft surgery. Follow these instructions at home: Eating and drinking   Eat a heart-healthy diet. Talk with your health care provider or a diet and nutrition specialist (dietitian) if you need help. A heart-healthy diet involves: ? Limiting unhealthy fats and increasing healthy fats. Some examples of healthy fats are olive oil and canola oil. ? Eating plant-based foods, such as fruits, vegetables, nuts, whole grains, and legumes (such as peas and lentils).  Limit alcohol intake to no more than 1 drink a day for nonpregnant women and 2 drinks a day for men. One drink equals 12 oz of beer, 5 oz of wine, or 1 oz of hard liquor. Lifestyle  Follow an exercise program as told by your health care provider.  Maintain a healthy  weight. Lose weight if your health care provider says that you need to do that.  Rest when you are tired.  Learn to manage your stress.  Do not use any products that contain nicotine or tobacco, such as cigarettes and e-cigarettes. If you need help quitting, ask your health care provider.  Do not abuse drugs. General instructions  Take over-the-counter and prescription medicines only as told by your health care provider.  Manage other health conditions as told by your health care provider.  Keep all follow-up visits as told by your health care provider. This is important. Contact a health care provider if:  You have chest pain or discomfort. This includes squeezing chest pain that may feel like indigestion (angina).  You have shortness of breath.  You have an irregular heartbeat.  You have unexplained fatigue.  You have unexplained pain or numbness in an arm, leg, or hip.  You have nausea, swelling of your hands or feet, and itchy skin. Get help right away if:  You have any symptoms of a heart attack, such as: ? Chest pain. ? Shortness of breath. ? Pain in your neck, jaw, arms, back, or stomach. ? Cold sweat. ? Nausea. ? Light-headedness.  You have any symptoms of a stroke. "BE FAST" is   an easy way to remember the main warning signs of a stroke: ? B - Balance. Signs are dizziness, sudden trouble walking, or loss of balance. ? E - Eyes. Signs are trouble seeing or a sudden change in vision. ? F - Face. Signs are sudden weakness or numbness of the face, or the face or eyelid drooping on one side. ? A - Arms. Signs are weakness or numbness in an arm. This happens suddenly and usually on one side of the body. ? S - Speech. Signs are sudden trouble speaking, slurred speech, or trouble understanding what people say. ? T - Time. Time to call emergency services. Write down what time symptoms started.  You have other signs of a stroke, such as: ? A sudden, severe headache with  no known cause. ? Nausea or vomiting. ? Seizure. These symptoms may represent a serious problem that is an emergency. Do not wait to see if the symptoms will go away. Get medical help right away. Call your local emergency services (911 in the [place] Do not drive yourself to the hospital. Summary  Atherosclerosis is narrowing and hardening of the arteries.  Arteries can become narrow or clogged with a buildup of fat, cholesterol, calcium, and other substances (plaque).  This condition may not cause any symptoms. If you do have symptoms, they are caused by damage to an area of your body that is not getting enough blood.  Treatment may include lifestyle changes and medicines. In some cases, surgery is needed. This information is not intended to replace advice given to you by your health care provider. Make sure you discuss any questions you have with your health care provider. Document Revised: 11/14/2017 Document Reviewed: 04/10/2017 Elsevier Patient Education  2020 Elsevier Inc.  

## 2020-05-09 NOTE — Progress Notes (Signed)
Raymond Moon 64 y.o.   Chief Complaint  Patient presents with  . Follow-up    6 month     HISTORY OF PRESENT ILLNESS: This is a 64 y.o. male here for 64-month follow-up on chronic medical problems. Has history of bullous emphysema, prediabetes, and recent CT of chest showed aortic atherosclerosis. Clinically doing well.  Denies any dyspnea on exertion or chest pain.  Able to walk and go up and down stairs without any difficulty. Has no complaints or medical concerns today. Needs to reschedule colonoscopy. Fully vaccinated against Covid.  HPI   Prior to Admission medications   Medication Sig Start Date End Date Taking? Authorizing Provider  rosuvastatin (CRESTOR) 10 MG tablet Take 1 tablet (10 mg total) by mouth daily. 05/09/20   Horald Pollen, MD    No Known Allergies  Patient Active Problem List   Diagnosis Date Noted  . Aortic atherosclerosis ([place]) 05/09/2020  . Abnormal CT of the chest 07/07/2018  . Loss of weight 04/29/2018  . Bullous emphysema ([place]) 04/29/2018  . Prediabetes 12/23/2016    No past medical history on file.  Past Surgical History:  Procedure Laterality Date  . VIDEO BRONCHOSCOPY Bilateral 08/21/2018   Procedure: VIDEO BRONCHOSCOPY WITH FLUORO;  Surgeon: Collene Gobble, MD;  Location: [facility] ENDOSCOPY;  Service: Cardiopulmonary;  Laterality: Bilateral;    Social History   Socioeconomic History  . Marital status: Married    Spouse name: Not on file  . Number of children: Not on file  . Years of education: Not on file  . Highest education level: Not on file  Occupational History  . Not on file  Tobacco Use  . Smoking status: Never Smoker  . Smokeless tobacco: Never Used  Substance and Sexual Activity  . Alcohol use: Yes    Alcohol/week: 0.0 standard drinks  . Drug use: No  . Sexual activity: Yes    Partners: Male  Other Topics Concern  . Not on file  Social History Narrative  . Not on file   Social Determinants of Health    Financial Resource Strain:   . Difficulty of Paying Living Expenses: Not on file  Food Insecurity:   . Worried About Charity fundraiser in the Last Year: Not on file  . Ran Out of Food in the Last Year: Not on file  Transportation Needs:   . Lack of Transportation (Medical): Not on file  . Lack of Transportation (Non-Medical): Not on file  Physical Activity:   . Days of Exercise per Week: Not on file  . Minutes of Exercise per Session: Not on file  Stress:   . Feeling of Stress : Not on file  Social Connections:   . Frequency of Communication with Friends and Family: Not on file  . Frequency of Social Gatherings with Friends and Family: Not on file  . Attends Religious Services: Not on file  . Active Member of Clubs or Organizations: Not on file  . Attends Archivist Meetings: Not on file  . Marital Status: Not on file  Intimate Partner Violence:   . Fear of Current or Ex-Partner: Not on file  . Emotionally Abused: Not on file  . Physically Abused: Not on file  . Sexually Abused: Not on file    No family history on file.   Review of Systems  Constitutional: Negative.  Negative for chills and fever.  HENT: Negative.  Negative for congestion and sore throat.   Respiratory: Negative.  Negative  for cough and shortness of breath.   Cardiovascular: Negative.  Negative for chest pain and palpitations.  Gastrointestinal: Negative for abdominal pain, diarrhea, nausea and vomiting.  Genitourinary: Negative.  Negative for dysuria and hematuria.  Musculoskeletal: Negative.  Negative for back pain and myalgias.  Skin: Negative.  Negative for rash.  Neurological: Negative.  Negative for dizziness and headaches.  All other systems reviewed and are negative.  Today's Vitals   05/09/20 0851 05/09/20 0900  BP: (!) 144/84 130/78  Pulse: 86   Temp: 98.2 F (36.8 C)   SpO2: 97%   Weight: 140 lb (63.5 kg)   Height: _0  (1.905 m)    Body mass index is 17.5 kg/m. Wt  Readings from Last 3 Encounters:  05/09/20 140 lb (63.5 kg)  11/09/19 145 lb (65.8 kg)  09/24/18 142 lb 12.8 oz (64.8 kg)     Physical Exam Vitals reviewed.  Constitutional:      Appearance: Normal appearance.  HENT:     Head: Normocephalic.  Eyes:     Extraocular Movements: Extraocular movements intact.     Pupils: Pupils are equal, round, and reactive to light.  Cardiovascular:     Rate and Rhythm: Normal rate and regular rhythm.     Pulses: Normal pulses.     Heart sounds: Normal heart sounds.  Pulmonary:     Effort: Pulmonary effort is normal.     Breath sounds: Normal breath sounds.  Musculoskeletal:        General: Normal range of motion.     Cervical back: Normal range of motion and neck supple.  Skin:    General: Skin is warm and dry.     Capillary Refill: Capillary refill takes less than 2 seconds.  Neurological:     General: No focal deficit present.     Mental Status: He is alert and oriented to person, place, and time.  Psychiatric:        Mood and Affect: Mood normal.        Behavior: Behavior normal.    A total of 30 minutes was spent with the patient, greater than 50% of which was in counseling/coordination of care regarding chronic medical problems, atherosclerosis and need to get started on statin medication, diet and nutrition, review of most recent blood work results, review of most recent office visit note, prognosis and need for follow-up.   ASSESSMENT & PLAN: Clinically stable.  No medical concerns identified during this visit. We'll start rosuvastatin 10 mg daily. Has appointment to see pulmonary doctor next month as follow-up. Follow-up with me in 6 months.  [name] was seen today for follow-up.  Diagnoses and all orders for this visit:  Bullous emphysema ([place]) -     CMP14+EGFR  Aortic atherosclerosis ([place]) -     Lipid panel -     CMP14+EGFR -     rosuvastatin (CRESTOR) 10 MG tablet; Take 1 tablet (10 mg total) by mouth  daily.  Prediabetes -     Hemoglobin A1c    Patient Instructions       If you have lab work done today you will be contacted with your lab results within the next 2 weeks.  If you have not heard from [place] then please contact us. The fastest way to get your results is to register for My Chart.   IF you received an x-ray today, you will receive an invoice from [facility]. Please contact [facility] at [phone] with questions or concerns regarding your  invoice.   IF you received labwork today, you will receive an invoice from [facility]. Please contact [facility] at [phone] with questions or concerns regarding your invoice.   Our billing staff will not be able to assist you with questions regarding bills from these companies.  You will be contacted with the lab results as soon as they are available. The fastest way to get your results is to activate your My Chart account. Instructions are located on the last page of this paperwork. If you have not heard from [place] regarding the results in 2 weeks, please contact this office.      Atherosclerosis  Atherosclerosis is narrowing and hardening of the arteries. Arteries are blood vessels that carry blood from the heart to all parts of the body. This blood contains oxygen. Arteries can become narrow or clogged with a buildup of fat, cholesterol, calcium, and other substances (plaque). Plaque decreases the amount of blood that can flow through the artery. Atherosclerosis can affect any artery in the body, including:  Heart arteries (coronary artery disease). This may cause a heart attack.  Brain arteries. This may cause a stroke (cerebrovascular accident).  Leg, arm, and pelvis arteries (peripheral artery disease). This may cause pain and numbness.  Kidney arteries. This may cause kidney (renal) failure. Treatment may slow the disease and prevent further damage to the heart, brain, peripheral arteries, and  kidneys. What are the causes? Atherosclerosis develops slowly over many years. The inner layers of your arteries become damaged and allow the gradual buildup of plaque. The exact cause of atherosclerosis is not fully understood. Symptoms of atherosclerosis do not occur until the artery becomes narrow or blocked. What increases the risk? The following factors may make you more likely to develop this condition:  High blood pressure.  High cholesterol.  Being middle-aged or older.  Having a family history of atherosclerosis.  Having high blood fats (triglycerides).  Diabetes.  Being overweight.  Smoking tobacco.  Not exercising enough (sedentary lifestyle).  Having a substance in the blood called C-reactive protein (CRP). This is a sign of increased levels of inflammation in the body.  Sleep apnea.  Being stressed.  Drinking too much alcohol. What are the signs or symptoms? This condition may not cause any symptoms. If you have symptoms, they are caused by damage to an area of your body that is not getting enough blood.  Coronary artery disease may cause chest pain and shortness of breath.  Decreased blood supply to your brain may cause a stroke. Signs of a stroke may include sudden: ? Weakness on one side of the body. ? Confusion. ? Changes in vision. ? Inability to speak or understand speech. ? Loss of balance, coordination, or the ability to walk. ? Severe headache. ? Loss of consciousness.  Peripheral arterial disease may cause pain and numbness, often in the legs and hips.  Renal failure may cause fatigue, nausea, swelling, and itchy skin. How is this diagnosed? This condition is diagnosed based on your medical history and a physical exam. During the exam:  Your health care provider will: ? Check your pulse in different places. ? Listen for a "whooshing" sound over your arteries (bruit).  You may have tests, such as: ? Blood tests to check your levels of  cholesterol, triglycerides, and CRP. ? Electrocardiogram (ECG) to check for heart damage. ? Chest X-ray to see if you have an enlarged heart, which is a sign of heart failure. ? Stress test to see how your heart  reacts to exercise. ? Echocardiogram to get images of the inside of your heart. ? Ankle-brachial index to compare blood pressure in your arms to blood pressure in your ankles. ? Ultrasound of your peripheral arteries to check blood flow. ? CT scan to check for damage to your heart or brain. ? X-rays of blood vessels after dye has been injected (angiogram) to check blood flow. How is this treated? Treatment starts with lifestyle changes, which may include:  Changing your diet.  Losing weight.  Reducing stress.  Exercising and being physically active more regularly.  Not smoking. You may also need medicine to:  Lower triglycerides and cholesterol.  Control blood pressure.  Prevent blood clots.  Lower inflammation in your body.  Control your blood sugar. Sometimes, surgery is needed to:  Remove plaque from an artery (endarterectomy).  Open or widen a narrowed heart artery (angioplasty).  Create a new path for your blood with one of these procedures: ? Heart (coronary) artery bypass graft surgery. ? Peripheral artery bypass graft surgery. Follow these instructions at home: Eating and drinking   Eat a heart-healthy diet. Talk with your health care provider or a diet and nutrition specialist (dietitian) if you need help. A heart-healthy diet involves: ? Limiting unhealthy fats and increasing healthy fats. Some examples of healthy fats are olive oil and canola oil. ? Eating plant-based foods, such as fruits, vegetables, nuts, whole grains, and legumes (such as peas and lentils).  Limit alcohol intake to no more than 1 drink a day for nonpregnant women and 2 drinks a day for men. One drink equals 12 oz of beer, 5 oz of wine, or 1 oz of hard  liquor. Lifestyle  Follow an exercise program as told by your health care provider.  Maintain a healthy weight. Lose weight if your health care provider says that you need to do that.  Rest when you are tired.  Learn to manage your stress.  Do not use any products that contain nicotine or tobacco, such as cigarettes and e-cigarettes. If you need help quitting, ask your health care provider.  Do not abuse drugs. General instructions  Take over-the-counter and prescription medicines only as told by your health care provider.  Manage other health conditions as told by your health care provider.  Keep all follow-up visits as told by your health care provider. This is important. Contact a health care provider if:  You have chest pain or discomfort. This includes squeezing chest pain that may feel like indigestion (angina).  You have shortness of breath.  You have an irregular heartbeat.  You have unexplained fatigue.  You have unexplained pain or numbness in an arm, leg, or hip.  You have nausea, swelling of your hands or feet, and itchy skin. Get help right away if:  You have any symptoms of a heart attack, such as: ? Chest pain. ? Shortness of breath. ? Pain in your neck, jaw, arms, back, or stomach. ? Cold sweat. ? Nausea. ? Light-headedness.  You have any symptoms of a stroke. "BE FAST" is an easy way to remember the main warning signs of a stroke: ? B - Balance. Signs are dizziness, sudden trouble walking, or loss of balance. ? E - Eyes. Signs are trouble seeing or a sudden change in vision. ? F - Face. Signs are sudden weakness or numbness of the face, or the face or eyelid drooping on one side. ? A - Arms. Signs are weakness or numbness in an arm. This  happens suddenly and usually on one side of the body. ? S - Speech. Signs are sudden trouble speaking, slurred speech, or trouble understanding what people say. ? T - Time. Time to call emergency services. Write down  what time symptoms started.  You have other signs of a stroke, such as: ? A sudden, severe headache with no known cause. ? Nausea or vomiting. ? Seizure. These symptoms may represent a serious problem that is an emergency. Do not wait to see if the symptoms will go away. Get medical help right away. Call your local emergency services (911 in the [place] Do not drive yourself to the hospital. Summary  Atherosclerosis is narrowing and hardening of the arteries.  Arteries can become narrow or clogged with a buildup of fat, cholesterol, calcium, and other substances (plaque).  This condition may not cause any symptoms. If you do have symptoms, they are caused by damage to an area of your body that is not getting enough blood.  Treatment may include lifestyle changes and medicines. In some cases, surgery is needed. This information is not intended to replace advice given to you by your health care provider. Make sure you discuss any questions you have with your health care provider. Document Revised: 11/14/2017 Document Reviewed: 04/10/2017 Elsevier Patient Education  2020 Elsevier Inc.     Agustina Caroli, MD [facility]

## 2020-05-10 LAB — CMP14+EGFR
ALT: 13 IU/L (ref 0–44)
AST: 20 IU/L (ref 0–40)
Albumin/Globulin Ratio: 0.9 — ABNORMAL LOW (ref 1.2–2.2)
Albumin: 3.9 g/dL (ref 3.8–4.8)
Alkaline Phosphatase: 79 IU/L (ref 44–121)
BUN/Creatinine Ratio: 12 (ref 10–24)
BUN: 13 mg/dL (ref 8–27)
Bilirubin Total: 0.6 mg/dL (ref 0.0–1.2)
CO2: 26 mmol/L (ref 20–29)
Calcium: 9.3 mg/dL (ref 8.6–10.2)
Chloride: 99 mmol/L (ref 96–106)
Creatinine, Ser: 1.05 mg/dL (ref 0.76–1.27)
GFR calc Af Amer: 87 mL/min/{1.73_m2} (ref >59)
GFR calc non Af Amer: 75 mL/min/{1.73_m2} (ref >59)
Globulin, Total: 4.2 g/dL (ref 1.5–4.5)
Glucose: 66 mg/dL (ref 65–99)
Potassium: 4.6 mmol/L (ref 3.5–5.2)
Sodium: 138 mmol/L (ref 134–144)
Total Protein: 8.1 g/dL (ref 6.0–8.5)

## 2020-05-10 LAB — LIPID PANEL
Chol/HDL Ratio: 2.3 ratio (ref 0.0–5.0)
Cholesterol, Total: 186 mg/dL (ref 100–199)
HDL: 80 mg/dL (ref >39)
LDL Chol Calc (NIH): 98 mg/dL (ref 0–99)
Triglycerides: 36 mg/dL (ref 0–149)
VLDL Cholesterol Cal: 8 mg/dL (ref 5–40)

## 2020-05-10 LAB — HEMOGLOBIN A1C
Est. average glucose Bld gHb Est-mCnc: 134 mg/dL
Hgb A1c MFr Bld: 6.3 % — ABNORMAL HIGH (ref 4.8–5.6)

## 2021-01-11 ENCOUNTER — Encounter: Payer: 59 | Admitting: Emergency Medicine

## 2021-02-07 ENCOUNTER — Encounter: Payer: 59 | Admitting: Emergency Medicine

## 2021-05-02 ENCOUNTER — Other Ambulatory Visit: Payer: Self-pay | Admitting: Emergency Medicine

## 2021-05-02 DIAGNOSIS — I700 Atherosclerosis of aorta: Secondary | ICD-10-CM

## 2022-04-21 ENCOUNTER — Other Ambulatory Visit: Payer: Self-pay | Admitting: Emergency Medicine

## 2022-04-21 DIAGNOSIS — I700 Atherosclerosis of aorta: Secondary | ICD-10-CM

## 2024-03-17 ENCOUNTER — Encounter: Payer: Self-pay | Admitting: Physician Assistant

## 2024-03-17 ENCOUNTER — Ambulatory Visit (INDEPENDENT_AMBULATORY_CARE_PROVIDER_SITE_OTHER): Admitting: Physician Assistant

## 2024-03-17 VITALS — BP 134/80 | HR 96 | Temp 98.3°F | Ht 75.0 in | Wt 137.0 lb

## 2024-03-17 DIAGNOSIS — Z125 Encounter for screening for malignant neoplasm of prostate: Secondary | ICD-10-CM

## 2024-03-17 DIAGNOSIS — R9431 Abnormal electrocardiogram [ECG] [EKG]: Secondary | ICD-10-CM | POA: Diagnosis not present

## 2024-03-17 DIAGNOSIS — J342 Deviated nasal septum: Secondary | ICD-10-CM | POA: Insufficient documentation

## 2024-03-17 DIAGNOSIS — R7303 Prediabetes: Secondary | ICD-10-CM

## 2024-03-17 DIAGNOSIS — J439 Emphysema, unspecified: Secondary | ICD-10-CM

## 2024-03-17 DIAGNOSIS — I517 Cardiomegaly: Secondary | ICD-10-CM | POA: Diagnosis not present

## 2024-03-17 DIAGNOSIS — E785 Hyperlipidemia, unspecified: Secondary | ICD-10-CM | POA: Insufficient documentation

## 2024-03-17 DIAGNOSIS — Z860101 Personal history of adenomatous and serrated colon polyps: Secondary | ICD-10-CM | POA: Diagnosis not present

## 2024-03-17 DIAGNOSIS — Z23 Encounter for immunization: Secondary | ICD-10-CM

## 2024-03-17 DIAGNOSIS — Z0000 Encounter for general adult medical examination without abnormal findings: Secondary | ICD-10-CM

## 2024-03-17 DIAGNOSIS — E440 Moderate protein-calorie malnutrition: Secondary | ICD-10-CM | POA: Insufficient documentation

## 2024-03-17 DIAGNOSIS — I700 Atherosclerosis of aorta: Secondary | ICD-10-CM

## 2024-03-17 MED ORDER — SHINGRIX 50 MCG/0.5ML IM SUSR
0.5000 mL | Freq: Once | INTRAMUSCULAR | 0 refills | Status: AC
Start: 2024-03-17 — End: 2024-03-17

## 2024-03-17 MED ORDER — SHINGRIX 50 MCG/0.5ML IM SUSR: 0.5000 mL | Freq: Once | INTRAMUSCULAR | 0 refills | Status: DC

## 2024-03-17 MED ORDER — ROSUVASTATIN CALCIUM 5 MG PO TABS: 5.0000 mg | ORAL_TABLET | Freq: Every day | ORAL | 1 refills | Status: DC

## 2024-03-17 NOTE — Patient Instructions (Addendum)
-  It was a pleasure to see you today! Please review your visit summary for helpful information -Lab results are usually available within 1-2 days and we will call once reviewed -I would encourage you to follow your care via MyChart where you can access lab results, notes, messages, and more -If you feel that we did a nice job today, please complete your after-visit survey and leave us a Google review! Your CMA today was Kieandra and your provider was Dan Waddell, PA-C, DMSc -Please return for follow-up in about 2 months  

## 2024-03-17 NOTE — Progress Notes (Signed)
 Date:  03/17/2024   Name:  Raymond Moon   DOB:  08-23-1955   MRN:  [id number]   Chief Complaint: Establish Care  HPI Raymond Moon is a pleasant 68 y.o. male new to the practice today to establish care.  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  Overall he reports feeling well, no chest pain or dyspnea.  He has chronically been underweight with BMI at or below 18 states he has been slim since his teenage years.  Last Physical: 11/09/19 Last Dental Exam: 58m ago Last Eye Exam: Cannot recall Last CRC screen: 2015 tubular adenoma, was due for repeat 2020 Last PSA: 12/20/16 normal Immunizations Due: Shingrix  dose 2, Prevnar 20    Medication list has been reviewed and updated.  Current Meds  Medication Sig   [DISCONTINUED] rosuvastatin  (CRESTOR ) 10 MG tablet TAKE 1 TABLET BY MOUTH EVERY DAY   [DISCONTINUED] Zoster Vaccine Adjuvanted (SHINGRIX ) injection Inject 0.5 mLs into the muscle once for 1 dose. Administer at 0 and 2-6 months for 2 total doses.  To be administered by pharmacy.     Review of Systems  Patient Active Problem List   Diagnosis Date Noted   History of adenomatous polyp of colon 03/17/2024   Left ventricular hypertrophy 03/17/2024   Abnormal EKG 03/17/2024   Moderate protein-calorie malnutrition (HCC) 03/17/2024   Deviated nasal septum 03/17/2024   Hyperlipidemia    Aortic atherosclerosis (HCC) 05/09/2020   Abnormal CT of the chest 07/07/2018   Loss of weight 04/29/2018   Bullous emphysema (HCC) 04/29/2018   Prediabetes 12/23/2016    No Known Allergies  Immunization History  Administered Date(s) Administered   Influenza Inj Mdck Quad Pf 05/12/2019   Influenza,inj,Quad PF,6+ Mos 05/18/2018   Influenza-Unspecified 08/02/2014, 06/18/2019, 06/12/2022   PFIZER(Purple Top)SARS-COV-2 Vaccination 10/28/2019, 11/28/2019   PNEUMOCOCCAL CONJUGATE-20  03/17/2024   Pneumococcal Polysaccharide-23 06/16/2017   Td 11/01/2007   Tdap 11/09/2019   Zoster Recombinant(Shingrix ) 06/06/2020    Past Surgical History:  Procedure Laterality Date   VIDEO BRONCHOSCOPY Bilateral 08/21/2018   Procedure: VIDEO BRONCHOSCOPY WITH FLUORO;  Surgeon: Shelah Lamar RAMAN, MD;  Location: [facility] ENDOSCOPY;  Service: Cardiopulmonary;  Laterality: Bilateral;    Social History   Tobacco Use   Smoking status: Never   Smokeless tobacco: Never  Vaping Use   Vaping status: Never Used  Substance Use Topics   Alcohol use: Yes    Comment: ocassionally   Drug use: No    Family History  Problem Relation Age of Onset   Ovarian cancer Brother         03/17/2024    2:15 PM  GAD 7 : Generalized Anxiety Score  Nervous, Anxious, on Edge 0  Control/stop worrying 0  Worry too much - different things 0  Trouble relaxing 0  Restless 0  Easily annoyed or irritable 0  Afraid - awful might happen 0  Total GAD 7 Score 0  Anxiety Difficulty Not difficult at all       03/17/2024    2:15 PM 05/09/2020    8:54 AM 11/09/2019    9:33 AM  Depression screen PHQ 2/9  Decreased Interest 0 0 0  Down, Depressed, Hopeless 0 0 0  PHQ - 2 Score 0 0 0    BP Readings from Last 3 Encounters:  03/17/24 134/80  05/09/20 130/78  11/09/19 123/76    Wt Readings  from Last 3 Encounters:  03/17/24 137 lb (62.1 kg)  05/09/20 140 lb (63.5 kg)  11/09/19 145 lb (65.8 kg)    BP 134/80 (Cuff Size: Normal)   Pulse 96   Temp 98.3 F (36.8 C)   Ht 6' 3 (1.905 m)   Wt 137 lb (62.1 kg)   SpO2 96%   BMI 17.12 kg/m   Physical Exam Vitals and nursing note reviewed.  Constitutional:      Appearance: He is well-groomed and underweight.  HENT:     Ears:     Comments: EAC clear bilaterally with good view of TM which is without effusion or erythema.     Nose: Septal deviation (toward left) present.     Mouth/Throat:     Mouth: Mucous membranes are moist. No oral lesions.      Dentition: Normal dentition.     Pharynx: No posterior oropharyngeal erythema.  Eyes:     Extraocular Movements: Extraocular movements intact.     Conjunctiva/sclera: Conjunctivae normal.     Pupils: Pupils are equal, round, and reactive to light.  Neck:     Thyroid: No thyromegaly.  Cardiovascular:     Rate and Rhythm: Normal rate and regular rhythm.     Chest Wall: Thrill present.     Heart sounds: Murmur heard.     Systolic murmur is present with a grade of 2/6.     No friction rub. No gallop.     Comments: Pulses 2+ at radial, PT, DP bilaterally. No carotid bruit. No peripheral edema Pulmonary:     Effort: Pulmonary effort is normal.     Breath sounds: Normal breath sounds.  Abdominal:     General: Bowel sounds are normal.     Palpations: Abdomen is soft. There is no mass.     Tenderness: There is no abdominal tenderness.  Genitourinary:    Comments: Genital/rectal exam declined Musculoskeletal:     Comments: Full ROM with strength 5/5 bilateral upper and lower extremities  Lymphadenopathy:     Cervical: No cervical adenopathy.  Skin:    General: Skin is warm.     Capillary Refill: Capillary refill takes less than 2 seconds.     Findings: No lesion or rash.  Neurological:     Mental Status: He is alert and oriented to person, place, and time.     Gait: Gait is intact.  Psychiatric:        Mood and Affect: Mood normal.        Behavior: Behavior normal.    EKG: Sinus rhythm but with LVH and probable LBBB.     Recent Labs     Component Value Date/Time   NA 138 05/09/2020 0955   K 4.6 05/09/2020 0955   CL 99 05/09/2020 0955   CO2 26 05/09/2020 0955   GLUCOSE 66 05/09/2020 0955   GLUCOSE 76 04/29/2018 1347   BUN 13 05/09/2020 0955   CREATININE 1.05 05/09/2020 0955   CREATININE 1.17 12/30/2014 1008   CALCIUM  9.3 05/09/2020 0955   PROT 8.1 05/09/2020 0955   ALBUMIN 3.9 05/09/2020 0955   AST 20 05/09/2020 0955   ALT 13 05/09/2020 0955   ALKPHOS 79 05/09/2020  0955   BILITOT 0.6 05/09/2020 0955   GFRNONAA 75 05/09/2020 0955   GFRAA 87 05/09/2020 0955    Lab Results  Component Value Date   WBC 5.9 11/09/2019   HGB 14.5 11/09/2019   HCT 44.6 11/09/2019   MCV 84 11/09/2019   PLT  234 11/09/2019   Lab Results  Component Value Date   HGBA1C 6.3 (H) 05/09/2020   Lab Results  Component Value Date   CHOL 186 05/09/2020   HDL 80 05/09/2020   LDLCALC 98 05/09/2020   TRIG 36 05/09/2020   CHOLHDL 2.3 05/09/2020   Lab Results  Component Value Date   TSH 2.030 11/09/2019     Assessment and Plan:  1. Annual physical exam (Primary) Encouraged healthy lifestyle including regular physical activity and consumption of whole fruits and vegetables. Encouraged routine dental and eye exams.  - PSA - Lipid panel - Hemoglobin A1c - CBC with Differential/Platelet - Comprehensive metabolic panel with GFR - TSH  2. Abnormal EKG LVH seems to have progressed from prior EKG. Needs repeat echo and cardiology referral.  - EKG 12-Lead - Ambulatory referral to Cardiology - ECHOCARDIOGRAM COMPLETE; Future  3. Left ventricular hypertrophy LVH seems to have progressed from prior EKG. Needs repeat echo and cardiology referral.  - Ambulatory referral to Cardiology - ECHOCARDIOGRAM COMPLETE; Future  4. History of adenomatous polyp of colon Overdue for colonoscopy - Ambulatory referral to Gastroenterology  5. Moderate protein-calorie malnutrition (HCC) - CBC with Differential/Platelet - Comprehensive metabolic panel with GFR - TSH  6. Hyperlipidemia, unspecified hyperlipidemia type - Lipid panel  7. Aortic atherosclerosis (HCC) Restart low-dose rosuvastatin  for vascular protection - Lipid panel - rosuvastatin  (CRESTOR ) 5 MG tablet; Take 1 tablet (5 mg total) by mouth daily.  Dispense: 90 tablet; Refill: 1  8. Prediabetes - Hemoglobin A1c  9. Bullous emphysema (HCC) Reviewed CT findings from several years ago. Might consider repeat in the  future but at present he is asymptomatic. Continue to follow clinically  10. Screening PSA (prostate specific antigen) - PSA  11. Need for shingles vaccine Paper prescription signed for patient to get dose 2 at local pharmacy - Zoster Vaccine Adjuvanted (SHINGRIX ) injection; Inject 0.5 mLs into the muscle once for 1 dose. Administer at 0 and 2-6 months for 2 total doses.  To be administered by pharmacy.  Dispense: 0.5 mL; Refill: 0  12. Encounter for immunization Prevnar 20 administered today - Pneumococcal conjugate vaccine 20-valent    Return in about 2 months (around 05/18/2024) for OV f/u chronic conditions.    Rolan Hoyle, PA-C, DMSc, Nutritionist [facility] Primary Care and Sports Medicine [facility] [phone]

## 2024-03-18 ENCOUNTER — Ambulatory Visit: Payer: Self-pay | Admitting: Physician Assistant

## 2024-03-18 LAB — COMPREHENSIVE METABOLIC PANEL WITH GFR
ALT: 8 IU/L (ref 0–44)
AST: 13 IU/L (ref 0–40)
Albumin: 4.1 g/dL (ref 3.9–4.9)
Alkaline Phosphatase: 86 IU/L (ref 44–121)
BUN/Creatinine Ratio: 11 (ref 10–24)
BUN: 13 mg/dL (ref 8–27)
Bilirubin Total: 0.7 mg/dL (ref 0.0–1.2)
CO2: 27 mmol/L (ref 20–29)
Calcium: 9.4 mg/dL (ref 8.6–10.2)
Chloride: 99 mmol/L (ref 96–106)
Creatinine, Ser: 1.21 mg/dL (ref 0.76–1.27)
Globulin, Total: 4.6 g/dL — ABNORMAL HIGH (ref 1.5–4.5)
Glucose: 81 mg/dL (ref 70–99)
Potassium: 4.7 mmol/L (ref 3.5–5.2)
Sodium: 139 mmol/L (ref 134–144)
Total Protein: 8.7 g/dL — ABNORMAL HIGH (ref 6.0–8.5)
eGFR: 66 mL/min/1.73 (ref >59)

## 2024-03-18 LAB — CBC WITH DIFFERENTIAL/PLATELET
Basophils Absolute: 0 x10E3/uL (ref 0.0–0.2)
Basos: 1 %
EOS (ABSOLUTE): 0.5 x10E3/uL — ABNORMAL HIGH (ref 0.0–0.4)
Eos: 10 %
Hematocrit: 46.9 % (ref 37.5–51.0)
Hemoglobin: 14.2 g/dL (ref 13.0–17.7)
Immature Grans (Abs): 0 x10E3/uL (ref 0.0–0.1)
Immature Granulocytes: 0 %
Lymphocytes Absolute: 0.8 x10E3/uL (ref 0.7–3.1)
Lymphs: 17 %
MCH: 26.2 pg — ABNORMAL LOW (ref 26.6–33.0)
MCHC: 30.3 g/dL — ABNORMAL LOW (ref 31.5–35.7)
MCV: 87 fL (ref 79–97)
Monocytes Absolute: 0.6 x10E3/uL (ref 0.1–0.9)
Monocytes: 14 %
Neutrophils Absolute: 2.6 x10E3/uL (ref 1.4–7.0)
Neutrophils: 58 %
Platelets: 279 x10E3/uL (ref 150–450)
RBC: 5.42 x10E6/uL (ref 4.14–5.80)
RDW: 13.7 % (ref 11.6–15.4)
WBC: 4.4 x10E3/uL (ref 3.4–10.8)

## 2024-03-18 LAB — PSA: Prostate Specific Ag, Serum: 2.3 ng/mL (ref 0.0–4.0)

## 2024-03-18 LAB — LIPID PANEL
Chol/HDL Ratio: 2.9 ratio (ref 0.0–5.0)
Cholesterol, Total: 205 mg/dL — ABNORMAL HIGH (ref 100–199)
HDL: 70 mg/dL (ref >39)
LDL Chol Calc (NIH): 124 mg/dL — ABNORMAL HIGH (ref 0–99)
Triglycerides: 58 mg/dL (ref 0–149)
VLDL Cholesterol Cal: 11 mg/dL (ref 5–40)

## 2024-03-18 LAB — HEMOGLOBIN A1C
Est. average glucose Bld gHb Est-mCnc: 128 mg/dL
Hgb A1c MFr Bld: 6.1 % — ABNORMAL HIGH (ref 4.8–5.6)

## 2024-03-18 LAB — TSH: TSH: 1.34 u[IU]/mL (ref 0.450–4.500)

## 2024-03-19 LAB — FERRITIN: Ferritin: 127 ng/mL (ref 30–400)

## 2024-03-19 LAB — IRON AND TIBC
Iron Saturation: 15 % (ref 15–55)
Iron: 52 ug/dL (ref 38–169)
Total Iron Binding Capacity: 339 ug/dL (ref 250–450)
UIBC: 287 ug/dL (ref 111–343)

## 2024-03-19 LAB — SPECIMEN STATUS REPORT

## 2024-03-23 NOTE — Progress Notes (Signed)
 This encounter was created in error - please disregard.

## 2024-04-22 ENCOUNTER — Ambulatory Visit: Attending: Physician Assistant

## 2024-04-22 DIAGNOSIS — R9431 Abnormal electrocardiogram [ECG] [EKG]: Secondary | ICD-10-CM

## 2024-04-22 DIAGNOSIS — I517 Cardiomegaly: Secondary | ICD-10-CM

## 2024-04-22 LAB — ECHOCARDIOGRAM COMPLETE
AR max vel: 2.72 cm2
AV Area VTI: 2.78 cm2
AV Area mean vel: 2.47 cm2
AV Mean grad: 2 mmHg
AV Peak grad: 3.5 mmHg
Ao pk vel: 0.94 m/s
Area-P 1/2: 3.85 cm2
S' Lateral: 2.24 cm

## 2024-05-19 ENCOUNTER — Encounter: Payer: Self-pay | Admitting: Physician Assistant

## 2024-05-19 ENCOUNTER — Ambulatory Visit (INDEPENDENT_AMBULATORY_CARE_PROVIDER_SITE_OTHER): Admitting: Physician Assistant

## 2024-05-19 VITALS — BP 118/82 | HR 97 | Temp 98.1°F | Ht 75.0 in | Wt 138.0 lb

## 2024-05-19 DIAGNOSIS — E785 Hyperlipidemia, unspecified: Secondary | ICD-10-CM

## 2024-05-19 DIAGNOSIS — I517 Cardiomegaly: Secondary | ICD-10-CM | POA: Diagnosis not present

## 2024-05-19 NOTE — Progress Notes (Signed)
 Date:  05/19/2024   Name:  Raymond Moon   DOB:  [birth year]   MRN:  [id number]   Chief Complaint: Medical Management of Chronic Issues  HPI [name] returns today for 11-month follow-up after establishing care with our practice 03/17/2024.    Due to suspected progression of LVH based on EKG at his last visit with me, I initiated cardiology referral and repeat echo.  Echo was completed 04/22/2024 and he has a scheduled visit with cardiology 05/27/2024. We restarted low-dose rosuvastatin  for vascular protection in the context of aortic atherosclerosis and other cardiac disease.  He is tolerating this well with good compliance.  I also referred him to GI as he is overdue for colonoscopy due to adenomatous polyp history.  This went to [facility] clinic Dr. Aundria but it does not appear this appointment has been scheduled yet.  He was given the phone number to contact them today.  His routine labs 2 months ago were generally unremarkable aside from mild HLD.   Medication list has been reviewed and updated.  Current Meds  Medication Sig   rosuvastatin  (CRESTOR ) 5 MG tablet Take 1 tablet (5 mg total) by mouth daily.     Review of Systems  Patient Active Problem List   Diagnosis Date Noted   History of adenomatous polyp of colon 03/17/2024   Left ventricular hypertrophy 03/17/2024   Abnormal EKG 03/17/2024   Moderate protein-calorie malnutrition 03/17/2024   Deviated nasal septum 03/17/2024   Hyperlipidemia    Aortic atherosclerosis 05/09/2020   Abnormal CT of the chest 07/07/2018   Loss of weight 04/29/2018   Bullous emphysema (HCC) 04/29/2018   Prediabetes 12/23/2016    No Known Allergies  Immunization History  Administered Date(s) Administered   Influenza Inj Mdck Quad Pf 05/12/2019   Influenza,inj,Quad PF,6+ Mos 05/18/2018   Influenza-Unspecified 08/02/2014, 06/18/2019, 06/12/2022   PFIZER(Purple Top)SARS-COV-2 Vaccination 10/28/2019, 11/28/2019   PNEUMOCOCCAL CONJUGATE-20  03/17/2024   Pneumococcal Polysaccharide-23 06/16/2017   Td 11/01/2007   Tdap 11/09/2019   Zoster Recombinant(Shingrix ) 06/06/2020    Past Surgical History:  Procedure Laterality Date   VIDEO BRONCHOSCOPY Bilateral 08/21/2018   Procedure: VIDEO BRONCHOSCOPY WITH FLUORO;  Surgeon: Shelah Lamar RAMAN, MD;  Location: [facility] ENDOSCOPY;  Service: Cardiopulmonary;  Laterality: Bilateral;    Social History   Tobacco Use   Smoking status: Never   Smokeless tobacco: Never  Vaping Use   Vaping status: Never Used  Substance Use Topics   Alcohol use: Yes    Comment: ocassionally   Drug use: No    Family History  Problem Relation Age of Onset   Ovarian cancer Sister         05/19/2024    1:45 PM 03/17/2024    2:15 PM  GAD 7 : Generalized Anxiety Score  Nervous, Anxious, on Edge 0 0  Control/stop worrying 0 0  Worry too much - different things 0 0  Trouble relaxing 0 0  Restless 0 0  Easily annoyed or irritable 0 0  Afraid - awful might happen 0 0  Total GAD 7 Score 0 0  Anxiety Difficulty Not difficult at all Not difficult at all       05/19/2024    1:45 PM 03/17/2024    2:15 PM 05/09/2020    8:54 AM  Depression screen PHQ 2/9  Decreased Interest 0 0 0  Down, Depressed, Hopeless 0 0 0  PHQ - 2 Score 0 0 0    BP Readings from Last 3 Encounters:  05/19/24 118/82  03/17/24 134/80  05/09/20 130/78    Wt Readings from Last 3 Encounters:  05/19/24 138 lb (62.6 kg)  03/17/24 137 lb (62.1 kg)  05/09/20 140 lb (63.5 kg)    BP 118/82 (Cuff Size: Normal)   Pulse 97   Temp 98.1 F (36.7 C)   Ht 6' 3 (1.905 m)   Wt 138 lb (62.6 kg)   SpO2 98%   BMI 17.25 kg/m   Physical Exam Vitals and nursing note reviewed.  Constitutional:      Appearance: Normal appearance.  Cardiovascular:     Rate and Rhythm: Normal rate.     Chest Wall: Thrill present.     Heart sounds: Murmur heard.     Diastolic murmur is present with a grade of 2/4.  Pulmonary:     Effort: Pulmonary effort  is normal.  Abdominal:     General: There is no distension.  Musculoskeletal:        General: Normal range of motion.  Skin:    General: Skin is warm and dry.  Neurological:     Mental Status: He is alert and oriented to person, place, and time.     Gait: Gait is intact.  Psychiatric:        Mood and Affect: Mood and affect normal.     Recent Labs     Component Value Date/Time   NA 139 03/17/2024 1506   K 4.7 03/17/2024 1506   CL 99 03/17/2024 1506   CO2 27 03/17/2024 1506   GLUCOSE 81 03/17/2024 1506   GLUCOSE 76 04/29/2018 1347   BUN 13 03/17/2024 1506   CREATININE 1.21 03/17/2024 1506   CREATININE 1.17 12/30/2014 1008   CALCIUM  9.4 03/17/2024 1506   PROT 8.7 (H) 03/17/2024 1506   ALBUMIN 4.1 03/17/2024 1506   AST 13 03/17/2024 1506   ALT 8 03/17/2024 1506   ALKPHOS 86 03/17/2024 1506   BILITOT 0.7 03/17/2024 1506   GFRNONAA 75 05/09/2020 0955   GFRAA 87 05/09/2020 0955    Lab Results  Component Value Date   WBC 4.4 03/17/2024   HGB 14.2 03/17/2024   HCT 46.9 03/17/2024   MCV 87 03/17/2024   PLT 279 03/17/2024   Lab Results  Component Value Date   HGBA1C 6.1 (H) 03/17/2024   HGBA1C 6.3 (H) 05/09/2020   HGBA1C 6.1 (H) 11/09/2019   Lab Results  Component Value Date   CHOL 205 (H) 03/17/2024   HDL 70 03/17/2024   LDLCALC 124 (H) 03/17/2024   TRIG 58 03/17/2024   CHOLHDL 2.9 03/17/2024   Lab Results  Component Value Date   TSH 1.340 03/17/2024      Assessment and Plan:  Left ventricular hypertrophy Assessment & Plan: Follow-up with cardiology next week as scheduled   Hyperlipidemia, unspecified hyperlipidemia type Assessment & Plan: Continue rosuvastatin  5 mg daily which is well-tolerated and likely to get him to goal at next lipid check.      Return in about 4 months (around 09/19/2024) for OV f/u HLD, LVH.    Rolan Hoyle, PA-C, DMSc, Nutritionist [facility] Primary Care and Sports Medicine [facility] [phone]

## 2024-05-19 NOTE — Assessment & Plan Note (Signed)
 Follow-up with cardiology next week as scheduled

## 2024-05-19 NOTE — Assessment & Plan Note (Signed)
 Continue rosuvastatin  5 mg daily which is well-tolerated and likely to get him to goal at next lipid check.

## 2024-05-25 NOTE — Progress Notes (Unsigned)
  Cardiology Office Note   Date:  05/27/2024  ID:  Raymond Moon, DOB 10-Aug-1956, MRN [id number] PCP: Manya Toribio SQUIBB, PA  [facility] Providers Cardiologist:  Caron Poser, MD     History of Present Illness Raymond Moon is a 68 y.o. male PMH HLD, COPD who presents for further evaluation management of LVH.  Patient reports he is overall feeling quite well.  He does cardio exercise 4 times per week and denies any functional limitations whatsoever.  He denies any dyspnea with exertion or chest discomfort, syncope, anything else concerning.  Last LDL 124 02/2024.  Relevant CVD History -TTE 04/2024 LVEF 50 to 55%, grade 1 diastolic dysfunction, mildly reduced RV function, no hemodynamically significant valvular disease.  There was no LVH reported.  Chamber sizes and thickness appear normal on my review. -CT chest 10/2019 no significant aortic atherosclerosis or CAC   ROS: Pt denies any chest discomfort, jaw pain, arm pain, palpitations, syncope, presyncope, orthopnea, PND, or LE edema.  Studies Reviewed I have independently reviewed the patient's ECG, previous cardiac testing, previous CT scans, previous medical records, recent blood work.  Physical Exam VS:  BP 120/86 (BP Location: Right Arm, Patient Position: Sitting, Cuff Size: Normal)   Pulse 86   Ht 6' 3 (1.905 m)   Wt 139 lb (63 kg)   SpO2 98%   BMI 17.37 kg/m        Wt Readings from Last 3 Encounters:  05/27/24 139 lb (63 kg)  05/19/24 138 lb (62.6 kg)  03/17/24 137 lb (62.1 kg)    GEN: No acute distress. NECK: No JVD; No carotid bruits. CARDIAC: RRR, no murmurs, rubs, gallops. RESPIRATORY:  Clear to auscultation. EXTREMITIES:  Warm and well-perfused. No edema.  ASSESSMENT AND PLAN LVH on ECG Patient is referred for LVH on ECG.  His ECG does show an impressive LVH with strain pattern with IVCD and significant repolarization abnormalities that is unchanged from prior office visit 03/17/2024.  He is extremely  functional and does cardio exercise at least 4 times per week and is completely asymptomatic.  A recent echo 04/2024 does not show any evidence of LVH based on measurements in report and on my personal review.  This is a benign ECG finding that does not require any further follow-up or treatment unless symptoms develop.  Plan: - No further testing indicated unless patient develops symptoms  HLD Last LDL 124 02/2024.  He has not had any recent chest imaging, but his CT from 2021 did not show any evidence of coronary artery calcium  or significant aortic atherosclerosis.  He is already on Crestor  5 mg daily.  Would recommend increasing to 10 mg daily.  In the absence of symptoms, no further testing or treatment indicated.        Dispo: RTC 1 year or sooner as needed  Signed, Caron Poser, MD

## 2024-05-27 ENCOUNTER — Ambulatory Visit

## 2024-05-27 VITALS — BP 120/86 | HR 86 | Ht 75.0 in | Wt 139.0 lb

## 2024-05-27 DIAGNOSIS — E782 Mixed hyperlipidemia: Secondary | ICD-10-CM | POA: Diagnosis not present

## 2024-05-27 DIAGNOSIS — I517 Cardiomegaly: Secondary | ICD-10-CM

## 2024-05-27 DIAGNOSIS — I700 Atherosclerosis of aorta: Secondary | ICD-10-CM

## 2024-05-27 MED ORDER — ROSUVASTATIN CALCIUM 10 MG PO TABS: 5.0000 mg | ORAL_TABLET | Freq: Every day | ORAL | 3 refills | Status: DC

## 2024-05-27 MED ORDER — ROSUVASTATIN CALCIUM 10 MG PO TABS
10.0000 mg | ORAL_TABLET | Freq: Every day | ORAL | 3 refills | Status: AC
Start: 2024-05-27 — End: 2025-05-22

## 2024-05-27 NOTE — Patient Instructions (Signed)
 Medication Instructions:  - INCREASE crestor  to 10 mg daily   *If you need a refill on your cardiac medications before your next appointment, please call your pharmacy*  Lab Work: No labs ordered today  If you have labs (blood work) drawn today and your tests are completely normal, you will receive your results only by: MyChart Message (if you have MyChart) OR A paper copy in the mail If you have any lab test that is abnormal or we need to change your treatment, we will call you to review the results.  Testing/Procedures: No test ordered today   Follow-Up: At [facility], you and your health needs are our priority.  As part of our continuing mission to provide you with exceptional heart care, our providers are all part of one team.  This team includes your primary Cardiologist (physician) and Advanced Practice Providers or APPs (Physician Assistants and Nurse Practitioners) who all work together to provide you with the care you need, when you need it.  Your next appointment:   1 year(s)  Provider:   You may see Caron Poser, MD or one of the following Advanced Practice Providers on your designated Care Team:   Lonni Meager, NP Lesley Maffucci, PA-C Bernardino Bring, PA-C Cadence Whetstone, PA-C Tylene Lunch, NP Barnie Hila, NP    We recommend signing up for the patient portal called MyChart.  Sign up information is provided on this After Visit Summary.  MyChart is used to connect with patients for Virtual Visits (Telemedicine).  Patients are able to view lab/test results, encounter notes, upcoming appointments, etc.  Non-urgent messages can be sent to your provider as well.   To learn more about what you can do with MyChart, go to [email or web address].

## 2024-07-21 ENCOUNTER — Ambulatory Visit: Admitting: Physician Assistant

## 2024-07-21 VITALS — BP 118/72 | HR 100 | Temp 98.1°F | Ht 75.0 in | Wt 142.0 lb

## 2024-07-21 DIAGNOSIS — S2002XA Contusion of left breast, initial encounter: Secondary | ICD-10-CM

## 2024-07-21 NOTE — Progress Notes (Signed)
 Date:  07/21/2024   Name:  Raymond Moon   DOB:  [birth year]   MRN:  [id number]   Chief Complaint: Mass (X1 week, Left nipple, hurts when being pressed on, not getting bigger )  HPI  Raymond Moon presents for 1 week left nipple swelling and pain without discharge.  This occurred 1 to 2 days after his wife pinched his left nipple which caused immediate pain per patient.  He did not notice any lump prior to this trauma.  He does not take any aspirin or prescription blood thinners.  He became concerned when it had not gone away after a week.  Medication list has been reviewed and updated.  Current Meds  Medication Sig   rosuvastatin  (CRESTOR ) 10 MG tablet Take 1 tablet (10 mg total) by mouth daily.     Review of Systems  Patient Active Problem List   Diagnosis Date Noted   History of adenomatous polyp of colon 03/17/2024   Left ventricular hypertrophy 03/17/2024   Abnormal EKG 03/17/2024   Moderate protein-calorie malnutrition 03/17/2024   Deviated nasal septum 03/17/2024   Hyperlipidemia    Aortic atherosclerosis 05/09/2020   Abnormal CT of the chest 07/07/2018   Loss of weight 04/29/2018   Bullous emphysema (HCC) 04/29/2018   Prediabetes 12/23/2016    No Known Allergies  Immunization History  Administered Date(s) Administered   Influenza Inj Mdck Quad Pf 05/12/2019   Influenza,inj,Quad PF,6+ Mos 05/18/2018   Influenza-Unspecified 08/02/2014, 06/18/2019, 06/12/2022   PFIZER(Purple Top)SARS-COV-2 Vaccination 10/28/2019, 11/28/2019   PNEUMOCOCCAL CONJUGATE-20 03/17/2024   Pneumococcal Polysaccharide-23 06/16/2017   Td 11/01/2007   Tdap 11/09/2019   Zoster Recombinant(Shingrix ) 06/06/2020    Past Surgical History:  Procedure Laterality Date   VIDEO BRONCHOSCOPY Bilateral 08/21/2018   Procedure: VIDEO BRONCHOSCOPY WITH FLUORO;  Surgeon: Shelah Lamar RAMAN, MD;  Location: [facility] ENDOSCOPY;  Service: Cardiopulmonary;  Laterality: Bilateral;    Social History   Tobacco Use    Smoking status: Never   Smokeless tobacco: Never  Vaping Use   Vaping status: Never Used  Substance Use Topics   Alcohol use: Yes    Comment: ocassionally   Drug use: No    Family History  Problem Relation Age of Onset   Ovarian cancer Sister         07/21/2024    4:15 PM 05/19/2024    1:45 PM 03/17/2024    2:15 PM  GAD 7 : Generalized Anxiety Score  Nervous, Anxious, on Edge 0 0 0  Control/stop worrying 0 0 0  Worry too much - different things 0 0 0  Trouble relaxing 0 0 0  Restless 0 0 0  Easily annoyed or irritable 0 0 0  Afraid - awful might happen 0 0 0  Total GAD 7 Score 0 0 0  Anxiety Difficulty  Not difficult at all Not difficult at all       07/21/2024    4:15 PM 05/19/2024    1:45 PM 03/17/2024    2:15 PM  Depression screen PHQ 2/9  Decreased Interest 0 0 0  Down, Depressed, Hopeless 0 0 0  PHQ - 2 Score 0 0 0    BP Readings from Last 3 Encounters:  07/21/24 118/72  05/27/24 120/86  05/19/24 118/82    Wt Readings from Last 3 Encounters:  07/21/24 142 lb (64.4 kg)  05/27/24 139 lb (63 kg)  05/19/24 138 lb (62.6 kg)    BP 118/72   Pulse 100   Temp 98.1  F (36.7 C)   Ht 6' 3 (1.905 m)   Wt 142 lb (64.4 kg)   SpO2 97%   BMI 17.75 kg/m   Physical Exam  Recent Labs     Component Value Date/Time   NA 139 03/17/2024 1506   K 4.7 03/17/2024 1506   CL 99 03/17/2024 1506   CO2 27 03/17/2024 1506   GLUCOSE 81 03/17/2024 1506   GLUCOSE 76 04/29/2018 1347   BUN 13 03/17/2024 1506   CREATININE 1.21 03/17/2024 1506   CREATININE 1.17 12/30/2014 1008   CALCIUM  9.4 03/17/2024 1506   PROT 8.7 (H) 03/17/2024 1506   ALBUMIN 4.1 03/17/2024 1506   AST 13 03/17/2024 1506   ALT 8 03/17/2024 1506   ALKPHOS 86 03/17/2024 1506   BILITOT 0.7 03/17/2024 1506   GFRNONAA 75 05/09/2020 0955   GFRAA 87 05/09/2020 0955    Lab Results  Component Value Date   WBC 4.4 03/17/2024   HGB 14.2 03/17/2024   HCT 46.9 03/17/2024   MCV 87 03/17/2024   PLT 279  03/17/2024   Lab Results  Component Value Date   HGBA1C 6.1 (H) 03/17/2024   HGBA1C 6.3 (H) 05/09/2020   HGBA1C 6.1 (H) 11/09/2019   Lab Results  Component Value Date   CHOL 205 (H) 03/17/2024   HDL 70 03/17/2024   LDLCALC 124 (H) 03/17/2024   TRIG 58 03/17/2024   CHOLHDL 2.9 03/17/2024   Lab Results  Component Value Date   TSH 1.340 03/17/2024      Assessment and Plan:  1. Traumatic hematoma of left breast (subareolar) (Primary) Patient reassured.  Encouraged to apply cold compress for 10 minutes at a time intermittently for the next 2 to 3 days, then leave the nipple completely alone.  If no resolution after 2 weeks, we will plan for an ultrasound.      Rolan Hoyle, PA-C, DMSc, Nutritionist [facility] Primary Care and Sports Medicine MedCenter [facility] [phone]

## 2024-09-20 ENCOUNTER — Ambulatory Visit: Admitting: Physician Assistant

## 2024-09-24 ENCOUNTER — Encounter: Payer: Self-pay | Admitting: Physician Assistant

## 2024-09-24 ENCOUNTER — Ambulatory Visit: Admitting: Physician Assistant

## 2024-09-24 ENCOUNTER — Other Ambulatory Visit: Payer: Self-pay | Admitting: Physician Assistant

## 2024-09-24 VITALS — BP 138/84 | HR 110 | Temp 97.9°F | Ht 75.0 in | Wt 144.0 lb

## 2024-09-24 DIAGNOSIS — S2002XA Contusion of left breast, initial encounter: Secondary | ICD-10-CM

## 2024-09-24 DIAGNOSIS — N63 Unspecified lump in unspecified breast: Secondary | ICD-10-CM

## 2024-09-24 DIAGNOSIS — M25512 Pain in left shoulder: Secondary | ICD-10-CM

## 2024-09-24 DIAGNOSIS — E785 Hyperlipidemia, unspecified: Secondary | ICD-10-CM

## 2024-09-24 MED ORDER — DICLOFENAC SODIUM 1 % EX GEL: 2.0000 g | Freq: Four times a day (QID) | CUTANEOUS | 1 refills | Status: AC

## 2024-09-24 NOTE — Progress Notes (Signed)
 "   Date:  09/24/2024   Name:  Raymond Moon   DOB:  12/13/1955   MRN:  [id number]   Chief Complaint: Medical Management of Chronic Issues and Mass (Lef sie of chest, present at last visit, not getting any bigger, hasn't went away )  HPI  Raymond Moon returns today primarily for fasting follow-up on cholesterol, presently taking rosuvastatin  10 mg.  Last LDL 124 in July with a pretty nice ratio of 2.9, but low-dose rosuvastatin  5 mg was restarted for patient given his aortic atherosclerosis and ASCVD risk calculation.  He was then seen by cardiology 05/27/2024 where his rosuvastatin  was increased to 10 mg, and he seems to be tolerating this well.  Due for repeat lipids today.  Mentions for the last 2 weeks, his left shoulder has been bothering him especially at the extremes of flexion and abduction.  Has been applying something similar to IcyHot with mild benefit.  He also mentions that the suspected hematoma of the left nipple has not resolved following our last conversation 2 months ago.  (PRIOR NOTE 07/21/2024): Raymond Moon presents for 1 week left nipple swelling and pain without discharge.  This occurred 1 to 2 days after his wife pinched his left nipple which caused immediate pain per patient.  He did not notice any lump prior to this trauma.  He does not take any aspirin or prescription blood thinners.  He became concerned when it had not gone away after a week.    Medication list has been reviewed and updated.  Active Medications[1]   Review of Systems  Patient Active Problem List   Diagnosis Date Noted   History of adenomatous polyp of colon 03/17/2024   Left ventricular hypertrophy 03/17/2024   Abnormal EKG 03/17/2024   Moderate protein-calorie malnutrition 03/17/2024   Deviated nasal septum 03/17/2024   Hyperlipidemia    Aortic atherosclerosis 05/09/2020   Abnormal CT of the chest 07/07/2018   Loss of weight 04/29/2018   Bullous emphysema (HCC) 04/29/2018   Prediabetes 12/23/2016     Allergies[2]  Immunization History  Administered Date(s) Administered   Influenza Inj Mdck Quad Pf 05/12/2019   Influenza,inj,Quad PF,6+ Mos 05/18/2018   Influenza-Unspecified 08/02/2014, 06/18/2019, 06/12/2022   PFIZER(Purple Top)SARS-COV-2 Vaccination 10/28/2019, 11/28/2019   PNEUMOCOCCAL CONJUGATE-20 03/17/2024   Pneumococcal Polysaccharide-23 06/16/2017   Td 11/01/2007   Tdap 11/09/2019   Zoster Recombinant(Shingrix ) 06/06/2020    Past Surgical History:  Procedure Laterality Date   VIDEO BRONCHOSCOPY Bilateral 08/21/2018   Procedure: VIDEO BRONCHOSCOPY WITH FLUORO;  Surgeon: Shelah Lamar RAMAN, MD;  Location: [facility] ENDOSCOPY;  Service: Cardiopulmonary;  Laterality: Bilateral;    Social History[3]  Family History  Problem Relation Age of Onset   Raymond Moon Sister         09/24/2024    9:12 AM 07/21/2024    4:15 PM 05/19/2024    1:45 PM 03/17/2024    2:15 PM  GAD 7 : Generalized Anxiety Score  Nervous, Anxious, on Edge 0 0  0  0   Control/stop worrying 0 0  0  0   Worry too much - different things 0 0  0  0   Trouble relaxing 0 0  0  0   Restless 0 0  0  0   Easily annoyed or irritable 0 0  0  0   Afraid - awful might happen 0 0  0  0   Total GAD 7 Score 0 0 0 0  Anxiety Difficulty Not difficult at all  Not difficult at all Not difficult at all     Data saved with a previous flowsheet row definition       09/24/2024    9:12 AM 07/21/2024    4:15 PM 05/19/2024    1:45 PM  Depression screen PHQ 2/9  Decreased Interest 0 0 0  Down, Depressed, Hopeless 0 0 0  PHQ - 2 Score 0 0 0    BP Readings from Last 3 Encounters:  09/24/24 138/84  07/21/24 118/72  05/27/24 120/86    Wt Readings from Last 3 Encounters:  09/24/24 144 lb (65.3 kg)  07/21/24 142 lb (64.4 kg)  05/27/24 139 lb (63 kg)    BP 138/84   Pulse (!) 110   Temp 97.9 F (36.6 C)   Ht 6' 3 (1.905 m)   Wt 144 lb (65.3 kg)   SpO2 99%   BMI 18.00 kg/m   Physical Exam Vitals and nursing note  reviewed.  Constitutional:      Appearance: Normal appearance.  Cardiovascular:     Rate and Rhythm: Normal rate.  Pulmonary:     Effort: Pulmonary effort is normal.  Abdominal:     General: There is no distension.  Musculoskeletal:        General: Normal range of motion.     Comments: ROM left shoulder slightly limited due to pain.  Negative empty can.  Skin:    General: Skin is warm and dry.  Neurological:     Mental Status: He is alert and oriented to person, place, and time.     Gait: Gait is intact.  Psychiatric:        Mood and Affect: Mood and affect normal.     Recent Labs     Component Value Date/Time   NA 139 03/17/2024 1506   K 4.7 03/17/2024 1506   CL 99 03/17/2024 1506   CO2 27 03/17/2024 1506   GLUCOSE 81 03/17/2024 1506   GLUCOSE 76 04/29/2018 1347   BUN 13 03/17/2024 1506   CREATININE 1.21 03/17/2024 1506   CREATININE 1.17 12/30/2014 1008   CALCIUM  9.4 03/17/2024 1506   PROT 8.7 (H) 03/17/2024 1506   ALBUMIN 4.1 03/17/2024 1506   AST 13 03/17/2024 1506   ALT 8 03/17/2024 1506   ALKPHOS 86 03/17/2024 1506   BILITOT 0.7 03/17/2024 1506   GFRNONAA 75 05/09/2020 0955   GFRAA 87 05/09/2020 0955    Lab Results  Component Value Date   WBC 4.4 03/17/2024   HGB 14.2 03/17/2024   HCT 46.9 03/17/2024   MCV 87 03/17/2024   PLT 279 03/17/2024   Lab Results  Component Value Date   HGBA1C 6.1 (H) 03/17/2024   HGBA1C 6.3 (H) 05/09/2020   HGBA1C 6.1 (H) 11/09/2019   Lab Results  Component Value Date   CHOL 205 (H) 03/17/2024   HDL 70 03/17/2024   LDLCALC 124 (H) 03/17/2024   TRIG 58 03/17/2024   CHOLHDL 2.9 03/17/2024   Lab Results  Component Value Date   TSH 1.340 03/17/2024        Assessment & Plan Hyperlipidemia, unspecified hyperlipidemia type Repeat fasting lipids today.  Not likely to make adjustments to rosuvastatin  dose. Orders:   Lipid panel  Traumatic hematoma of left breast (subareolar) Ordered ultrasound of left breast for  further evaluation.  Radiology insisted on bilateral mammogram despite this being a thin male with a BMI of 18 and virtually no breast tissue in the context of a trauma to the  left nipple. Orders:   US  LIMITED ULTRASOUND INCLUDING AXILLA LEFT BREAST ; Future  Breast mass in male Ordered ultrasound of left breast for further evaluation.  Radiology insisted on bilateral mammogram despite this being a thin male with a BMI of 18 and virtually no breast tissue in the context of a trauma to the left nipple. Orders:   US  LIMITED ULTRASOUND INCLUDING AXILLA LEFT BREAST ; Future  Acute pain of left shoulder Suspect mild rotator cuff/shoulder strain. Will try topical diclofenac  and f/u with me if not improving.  Orders:   diclofenac  Sodium (VOLTAREN ) 1 % GEL; Apply 2 g topically 4 (four) times daily. Use on affected joint up to 4x/day as needed. 2 grams is roughly 4 fingertips' worth of gel.    Follow-up 6 months CPE, sooner if needed  Rolan Hoyle, PA-C, DMSc, DipACLM, Nutritionist [facility] [phone]      [1]  Current Meds  Medication Sig   diclofenac  Sodium (VOLTAREN ) 1 % GEL Apply 2 g topically 4 (four) times daily. Use on affected joint up to 4x/day as needed. 2 grams is roughly 4 fingertips' worth of gel.   rosuvastatin  (CRESTOR ) 10 MG tablet Take 1 tablet (10 mg total) by mouth daily.  [2] No Known Allergies [3]  Social History Tobacco Use   Smoking status: Never   Smokeless tobacco: Never  Vaping Use   Vaping status: Never Used  Substance Use Topics   Alcohol use: Yes    Comment: ocassionally   Drug use: No   "

## 2024-09-24 NOTE — Assessment & Plan Note (Addendum)
 Repeat fasting lipids today.  Not likely to make adjustments to rosuvastatin  dose. Orders:   Lipid panel

## 2024-10-05 ENCOUNTER — Other Ambulatory Visit

## 2024-10-05 ENCOUNTER — Encounter

## 2025-03-21 ENCOUNTER — Encounter: Admitting: Physician Assistant
